# Patient Record
Sex: Male | Born: 1960
Health system: Southern US, Community
[De-identification: ages and names within clinical notes are randomized; demographics above are authoritative.]

## PROBLEM LIST (undated history)

## (undated) HISTORY — PX: ELBOW SURGERY: SHX618

---

## 2014-04-07 ENCOUNTER — Other Ambulatory Visit: Payer: Self-pay | Admitting: Family Medicine

## 2014-04-07 DIAGNOSIS — R109 Unspecified abdominal pain: Secondary | ICD-10-CM

## 2014-04-11 ENCOUNTER — Ambulatory Visit
Admission: RE | Admit: 2014-04-11 | Discharge: 2014-04-11 | Disposition: A | Discharge status: Home / Self Care | Payer: Managed Care, Other (non HMO) | Source: Ambulatory Visit | Attending: Family Medicine | Admitting: Family Medicine

## 2014-04-11 DIAGNOSIS — R109 Unspecified abdominal pain: Secondary | ICD-10-CM

## 2014-04-11 MED ORDER — IOPAMIDOL (ISOVUE-300) INJECTION 61%
100.0000 mL | Freq: Once | INTRAVENOUS | Status: AC | PRN
  Administered 2014-04-11: 100 mL via INTRAVENOUS

## 2014-12-05 ENCOUNTER — Observation Stay (HOSPITAL_COMMUNITY): Payer: Managed Care, Other (non HMO) | Admitting: Anesthesiology

## 2014-12-05 ENCOUNTER — Observation Stay (HOSPITAL_COMMUNITY)
Admission: EM | Admit: 2014-12-05 | Discharge: 2014-12-06 | Disposition: A | Discharge status: Home / Self Care | Payer: Managed Care, Other (non HMO) | Attending: Surgery | Admitting: Surgery

## 2014-12-05 ENCOUNTER — Encounter (HOSPITAL_COMMUNITY): Payer: Self-pay | Admitting: Emergency Medicine

## 2014-12-05 ENCOUNTER — Emergency Department (HOSPITAL_COMMUNITY): Payer: Managed Care, Other (non HMO)

## 2014-12-05 ENCOUNTER — Encounter (HOSPITAL_COMMUNITY)
Admission: EM | Disposition: A | Discharge status: Home / Self Care | Payer: Self-pay | Source: Home / Self Care | Attending: Emergency Medicine

## 2014-12-05 DIAGNOSIS — Z9049 Acquired absence of other specified parts of digestive tract: Secondary | ICD-10-CM

## 2014-12-05 DIAGNOSIS — K3580 Unspecified acute appendicitis: Secondary | ICD-10-CM | POA: Diagnosis present

## 2014-12-05 DIAGNOSIS — F17210 Nicotine dependence, cigarettes, uncomplicated: Secondary | ICD-10-CM | POA: Insufficient documentation

## 2014-12-05 DIAGNOSIS — K4090 Unilateral inguinal hernia, without obstruction or gangrene, not specified as recurrent: Secondary | ICD-10-CM | POA: Insufficient documentation

## 2014-12-05 DIAGNOSIS — K353 Acute appendicitis with localized peritonitis: Secondary | ICD-10-CM | POA: Diagnosis not present

## 2014-12-05 HISTORY — PX: LAPAROSCOPIC APPENDECTOMY: SHX408

## 2014-12-05 LAB — CBC
HCT: 46.4 % (ref 39.0–52.0)
Hemoglobin: 16 g/dL (ref 13.0–17.0)
MCH: 31.2 pg (ref 26.0–34.0)
MCHC: 34.5 g/dL (ref 30.0–36.0)
MCV: 90.4 fL (ref 78.0–100.0)
PLATELETS: 223 10*3/uL (ref 150–400)
RBC: 5.13 MIL/uL (ref 4.22–5.81)
RDW: 12.8 % (ref 11.5–15.5)
WBC: 10.8 10*3/uL — ABNORMAL HIGH (ref 4.0–10.5)

## 2014-12-05 LAB — URINALYSIS, ROUTINE W REFLEX MICROSCOPIC
GLUCOSE, UA: NEGATIVE mg/dL
KETONES UR: NEGATIVE mg/dL
LEUKOCYTES UA: NEGATIVE
Nitrite: NEGATIVE
PH: 6 (ref 5.0–8.0)
Protein, ur: NEGATIVE mg/dL
Specific Gravity, Urine: 1.027 (ref 1.005–1.030)
Urobilinogen, UA: 1 mg/dL (ref 0.0–1.0)

## 2014-12-05 LAB — COMPREHENSIVE METABOLIC PANEL
ALK PHOS: 85 U/L (ref 38–126)
ALT: 11 U/L — ABNORMAL LOW (ref 17–63)
ANION GAP: 7 (ref 5–15)
AST: 13 U/L — ABNORMAL LOW (ref 15–41)
Albumin: 3.9 g/dL (ref 3.5–5.0)
BILIRUBIN TOTAL: 1.6 mg/dL — AB (ref 0.3–1.2)
BUN: 12 mg/dL (ref 6–20)
CALCIUM: 9.1 mg/dL (ref 8.9–10.3)
CO2: 27 mmol/L (ref 22–32)
Chloride: 104 mmol/L (ref 101–111)
Creatinine, Ser: 0.97 mg/dL (ref 0.61–1.24)
GFR calc Af Amer: >60 mL/min (ref >60)
Glucose, Bld: 114 mg/dL — ABNORMAL HIGH (ref 65–99)
POTASSIUM: 4.1 mmol/L (ref 3.5–5.1)
Sodium: 138 mmol/L (ref 135–145)
TOTAL PROTEIN: 7.3 g/dL (ref 6.5–8.1)

## 2014-12-05 LAB — URINE MICROSCOPIC-ADD ON

## 2014-12-05 LAB — LIPASE, BLOOD: Lipase: 22 U/L (ref 11–51)

## 2014-12-05 SURGERY — APPENDECTOMY, LAPAROSCOPIC
Anesthesia: General

## 2014-12-05 MED ORDER — ONDANSETRON 4 MG PO TBDP: 4.0000 mg | ORAL_TABLET | Freq: Four times a day (QID) | ORAL | Status: DC | PRN

## 2014-12-05 MED ORDER — SODIUM CHLORIDE 0.9 % IV BOLUS (SEPSIS)
1000.0000 mL | Freq: Once | INTRAVENOUS | Status: AC
  Administered 2014-12-05: 1000 mL via INTRAVENOUS

## 2014-12-05 MED ORDER — KETOROLAC TROMETHAMINE 30 MG/ML IJ SOLN: 30.0000 mg | Freq: Once | INTRAMUSCULAR | Status: DC

## 2014-12-05 MED ORDER — ONDANSETRON HCL 4 MG/2ML IJ SOLN: 4.0000 mg | Freq: Four times a day (QID) | INTRAMUSCULAR | Status: DC | PRN

## 2014-12-05 MED ORDER — OXYCODONE HCL 5 MG PO TABS: 5.0000 mg | ORAL_TABLET | Freq: Once | ORAL | Status: DC | PRN

## 2014-12-05 MED ORDER — ONDANSETRON HCL 4 MG/2ML IJ SOLN
4.0000 mg | Freq: Four times a day (QID) | INTRAMUSCULAR | Status: DC | PRN
  Administered 2014-12-05: 4 mg via INTRAVENOUS
  Filled 2014-12-05: qty 2

## 2014-12-05 MED ORDER — PIPERACILLIN-TAZOBACTAM 3.375 G IVPB: 3.3750 g | Freq: Three times a day (TID) | INTRAVENOUS | Status: DC

## 2014-12-05 MED ORDER — FENTANYL CITRATE (PF) 100 MCG/2ML IJ SOLN
INTRAMUSCULAR | Status: AC
  Filled 2014-12-05: qty 4

## 2014-12-05 MED ORDER — DIPHENHYDRAMINE HCL 25 MG PO CAPS: 25.0000 mg | ORAL_CAPSULE | Freq: Four times a day (QID) | ORAL | Status: DC | PRN

## 2014-12-05 MED ORDER — PROPOFOL 10 MG/ML IV BOLUS
INTRAVENOUS | Status: DC | PRN
  Administered 2014-12-05: 40 mg via INTRAVENOUS
  Administered 2014-12-05: 140 mg via INTRAVENOUS

## 2014-12-05 MED ORDER — HEPARIN SODIUM (PORCINE) 5000 UNIT/ML IJ SOLN
5000.0000 [IU] | Freq: Three times a day (TID) | INTRAMUSCULAR | Status: DC
  Administered 2014-12-05 – 2014-12-06 (×2): 5000 [IU] via SUBCUTANEOUS
  Filled 2014-12-05 (×5): qty 1

## 2014-12-05 MED ORDER — EPHEDRINE SULFATE 50 MG/ML IJ SOLN
INTRAMUSCULAR | Status: DC | PRN
  Administered 2014-12-05: 10 mg via INTRAVENOUS

## 2014-12-05 MED ORDER — PANTOPRAZOLE SODIUM 40 MG IV SOLR
40.0000 mg | Freq: Every day | INTRAVENOUS | Status: DC
  Administered 2014-12-05: 40 mg via INTRAVENOUS
  Filled 2014-12-05 (×2): qty 40

## 2014-12-05 MED ORDER — HYDROMORPHONE HCL 1 MG/ML IJ SOLN
0.2500 mg | INTRAMUSCULAR | Status: DC | PRN
  Administered 2014-12-05 (×2): 0.5 mg via INTRAVENOUS

## 2014-12-05 MED ORDER — HYDROCODONE-ACETAMINOPHEN 5-325 MG PO TABS: 1.0000 | ORAL_TABLET | ORAL | Status: DC | PRN

## 2014-12-05 MED ORDER — PIPERACILLIN-TAZOBACTAM 3.375 G IVPB
3.3750 g | Freq: Three times a day (TID) | INTRAVENOUS | Status: DC
  Administered 2014-12-05: 3.375 g via INTRAVENOUS
  Filled 2014-12-05: qty 50

## 2014-12-05 MED ORDER — MORPHINE SULFATE (PF) 2 MG/ML IV SOLN: 2.0000 mg | INTRAVENOUS | Status: DC | PRN

## 2014-12-05 MED ORDER — HYDROMORPHONE HCL 1 MG/ML IJ SOLN
INTRAMUSCULAR | Status: AC
  Filled 2014-12-05: qty 1

## 2014-12-05 MED ORDER — FENTANYL CITRATE (PF) 250 MCG/5ML IJ SOLN
INTRAMUSCULAR | Status: AC
  Filled 2014-12-05: qty 25

## 2014-12-05 MED ORDER — FENTANYL CITRATE (PF) 100 MCG/2ML IJ SOLN
INTRAMUSCULAR | Status: DC | PRN
  Administered 2014-12-05: 100 ug via INTRAVENOUS
  Administered 2014-12-05 (×3): 50 ug via INTRAVENOUS
  Administered 2014-12-05: 100 ug via INTRAVENOUS
  Administered 2014-12-05 (×2): 50 ug via INTRAVENOUS

## 2014-12-05 MED ORDER — LACTATED RINGERS IR SOLN
Status: DC | PRN
  Administered 2014-12-05: 1

## 2014-12-05 MED ORDER — NICOTINE 21 MG/24HR TD PT24
21.0000 mg | MEDICATED_PATCH | Freq: Every day | TRANSDERMAL | Status: DC
  Administered 2014-12-05: 21 mg via TRANSDERMAL
  Filled 2014-12-05 (×2): qty 1

## 2014-12-05 MED ORDER — NEOSTIGMINE METHYLSULFATE 10 MG/10ML IV SOLN
INTRAVENOUS | Status: DC | PRN
  Administered 2014-12-05: 3 mg via INTRAVENOUS

## 2014-12-05 MED ORDER — IOHEXOL 300 MG/ML  SOLN
50.0000 mL | Freq: Once | INTRAMUSCULAR | Status: AC | PRN
  Administered 2014-12-05: 50 mL via ORAL

## 2014-12-05 MED ORDER — IOHEXOL 300 MG/ML  SOLN
100.0000 mL | Freq: Once | INTRAMUSCULAR | Status: AC | PRN
  Administered 2014-12-05: 100 mL via INTRAVENOUS

## 2014-12-05 MED ORDER — KCL IN DEXTROSE-NACL 20-5-0.45 MEQ/L-%-% IV SOLN
INTRAVENOUS | Status: DC
  Administered 2014-12-05: 19:00:00 via INTRAVENOUS
  Filled 2014-12-05 (×3): qty 1000

## 2014-12-05 MED ORDER — ONDANSETRON HCL 4 MG/2ML IJ SOLN
INTRAMUSCULAR | Status: DC | PRN
  Administered 2014-12-05: 4 mg via INTRAVENOUS

## 2014-12-05 MED ORDER — LACTATED RINGERS IV SOLN
INTRAVENOUS | Status: DC
  Administered 2014-12-05: 1000 mL via INTRAVENOUS

## 2014-12-05 MED ORDER — GLYCOPYRROLATE 0.2 MG/ML IJ SOLN
INTRAMUSCULAR | Status: DC | PRN
  Administered 2014-12-05: 0.4 mg via INTRAVENOUS

## 2014-12-05 MED ORDER — MORPHINE SULFATE (PF) 4 MG/ML IV SOLN
4.0000 mg | Freq: Once | INTRAVENOUS | Status: DC
  Filled 2014-12-05: qty 1

## 2014-12-05 MED ORDER — MORPHINE SULFATE (PF) 2 MG/ML IV SOLN
2.0000 mg | INTRAVENOUS | Status: DC | PRN
  Administered 2014-12-05: 2 mg via INTRAVENOUS
  Filled 2014-12-05: qty 1

## 2014-12-05 MED ORDER — BUPIVACAINE-EPINEPHRINE (PF) 0.25% -1:200000 IJ SOLN
INTRAMUSCULAR | Status: DC | PRN
  Administered 2014-12-05: 10 mL via PERINEURAL

## 2014-12-05 MED ORDER — POTASSIUM CHLORIDE IN NACL 20-0.9 MEQ/L-% IV SOLN
INTRAVENOUS | Status: DC
  Filled 2014-12-05 (×2): qty 1000

## 2014-12-05 MED ORDER — MIDAZOLAM HCL 2 MG/2ML IJ SOLN
INTRAMUSCULAR | Status: AC
  Filled 2014-12-05: qty 4

## 2014-12-05 MED ORDER — PHENYLEPHRINE HCL 10 MG/ML IJ SOLN
INTRAMUSCULAR | Status: DC | PRN
  Administered 2014-12-05: 120 ug via INTRAVENOUS
  Administered 2014-12-05: 80 ug via INTRAVENOUS

## 2014-12-05 MED ORDER — LIDOCAINE HCL (CARDIAC) 20 MG/ML IV SOLN
INTRAVENOUS | Status: DC | PRN
  Administered 2014-12-05: 50 mg via INTRAVENOUS

## 2014-12-05 MED ORDER — MIDAZOLAM HCL 5 MG/5ML IJ SOLN
INTRAMUSCULAR | Status: DC | PRN
  Administered 2014-12-05: 2 mg via INTRAVENOUS

## 2014-12-05 MED ORDER — PROPOFOL 10 MG/ML IV BOLUS
INTRAVENOUS | Status: AC
  Filled 2014-12-05: qty 20

## 2014-12-05 MED ORDER — PIPERACILLIN-TAZOBACTAM 3.375 G IVPB
INTRAVENOUS | Status: AC
Start: 2014-12-05 — End: 2014-12-05
  Filled 2014-12-05: qty 50

## 2014-12-05 MED ORDER — ONDANSETRON HCL 4 MG/2ML IJ SOLN
INTRAMUSCULAR | Status: AC
  Filled 2014-12-05: qty 2

## 2014-12-05 MED ORDER — DIPHENHYDRAMINE HCL 50 MG/ML IJ SOLN: 25.0000 mg | Freq: Four times a day (QID) | INTRAMUSCULAR | Status: DC | PRN

## 2014-12-05 MED ORDER — OXYCODONE HCL 5 MG/5ML PO SOLN
5.0000 mg | Freq: Once | ORAL | Status: DC | PRN
  Filled 2014-12-05: qty 5

## 2014-12-05 MED ORDER — LIDOCAINE HCL (CARDIAC) 20 MG/ML IV SOLN
INTRAVENOUS | Status: AC
  Filled 2014-12-05: qty 5

## 2014-12-05 MED ORDER — ONDANSETRON HCL 4 MG/2ML IJ SOLN
4.0000 mg | Freq: Once | INTRAMUSCULAR | Status: AC
  Administered 2014-12-05: 4 mg via INTRAVENOUS
  Filled 2014-12-05: qty 2

## 2014-12-05 MED ORDER — MORPHINE SULFATE (PF) 2 MG/ML IV SOLN: 1.0000 mg | INTRAVENOUS | Status: DC | PRN

## 2014-12-05 MED ORDER — BUPIVACAINE-EPINEPHRINE (PF) 0.25% -1:200000 IJ SOLN
INTRAMUSCULAR | Status: AC
  Filled 2014-12-05: qty 30

## 2014-12-05 MED ORDER — ROCURONIUM BROMIDE 100 MG/10ML IV SOLN
INTRAVENOUS | Status: DC | PRN
  Administered 2014-12-05: 40 mg via INTRAVENOUS

## 2014-12-05 MED ORDER — SUCCINYLCHOLINE CHLORIDE 20 MG/ML IJ SOLN
INTRAMUSCULAR | Status: DC | PRN
  Administered 2014-12-05: 80 mg via INTRAVENOUS

## 2014-12-05 SURGICAL SUPPLY — 36 items
APPLIER CLIP ROT 10 11.4 M/L (STAPLE)
CABLE HIGH FREQUENCY MONO STRZ (ELECTRODE) IMPLANT
CLIP APPLIE ROT 10 11.4 M/L (STAPLE) IMPLANT
COVER SURGICAL LIGHT HANDLE (MISCELLANEOUS) ×2 IMPLANT
CUTTER FLEX LINEAR 45M (STAPLE) IMPLANT
DECANTER SPIKE VIAL GLASS SM (MISCELLANEOUS) ×2 IMPLANT
DRAPE LAPAROSCOPIC ABDOMINAL (DRAPES) ×2 IMPLANT
ELECT REM PT RETURN 9FT ADLT (ELECTROSURGICAL) ×2
ELECTRODE REM PT RTRN 9FT ADLT (ELECTROSURGICAL) ×1 IMPLANT
ENDOLOOP SUT PDS II  0 18 (SUTURE)
ENDOLOOP SUT PDS II 0 18 (SUTURE) IMPLANT
GLOVE BIOGEL M 8.0 STRL (GLOVE) ×2 IMPLANT
GOWN STRL REUS W/TWL XL LVL3 (GOWN DISPOSABLE) ×4 IMPLANT
KIT BASIN OR (CUSTOM PROCEDURE TRAY) ×2 IMPLANT
LIQUID BAND (GAUZE/BANDAGES/DRESSINGS) ×2 IMPLANT
NS IRRIG 1000ML POUR BTL (IV SOLUTION) ×2 IMPLANT
POUCH RETRIEVAL ECOSAC 10 (ENDOMECHANICALS) ×1 IMPLANT
POUCH RETRIEVAL ECOSAC 10MM (ENDOMECHANICALS) ×1
POUCH SPECIMEN RETRIEVAL 10MM (ENDOMECHANICALS) IMPLANT
RELOAD 45 VASCULAR/THIN (ENDOMECHANICALS) IMPLANT
RELOAD STAPLE TA45 3.5 REG BLU (ENDOMECHANICALS) IMPLANT
SCISSORS LAP 5X45 EPIX DISP (ENDOMECHANICALS) ×2 IMPLANT
SCRUB PCMX 4 OZ (MISCELLANEOUS) ×2 IMPLANT
SET IRRIG TUBING LAPAROSCOPIC (IRRIGATION / IRRIGATOR) ×2 IMPLANT
SHEARS CURVED HARMONIC AC 45CM (MISCELLANEOUS) ×2 IMPLANT
SLEEVE XCEL OPT CAN 5 100 (ENDOMECHANICALS) ×2 IMPLANT
STAPLER VISISTAT 35W (STAPLE) IMPLANT
SUT MNCRL AB 4-0 PS2 18 (SUTURE) ×2 IMPLANT
SUT VIC AB 4-0 SH 18 (SUTURE) ×2 IMPLANT
TOWEL OR 17X26 10 PK STRL BLUE (TOWEL DISPOSABLE) ×4 IMPLANT
TRAY FOLEY W/METER SILVER 14FR (SET/KITS/TRAYS/PACK) IMPLANT
TRAY LAPAROSCOPIC (CUSTOM PROCEDURE TRAY) ×2 IMPLANT
TROCAR BLADELESS OPT 5 100 (ENDOMECHANICALS) ×2 IMPLANT
TROCAR HASSON 5MM (TROCAR) ×2 IMPLANT
TROCAR XCEL BLUNT TIP 100MML (ENDOMECHANICALS) ×2 IMPLANT
TROCAR XCEL NON-BLD 11X100MML (ENDOMECHANICALS) IMPLANT

## 2014-12-05 NOTE — Transfer of Care (Signed)
Immediate Anesthesia Transfer of Care Note  Patient: Clinton GilmoreChristopher Padilla  Procedure(s) Performed: Procedure(s): APPENDECTOMY LAPAROSCOPIC (N/A)  Patient Location: PACU  Anesthesia Type:General  Level of Consciousness: sedated, patient cooperative and responds to stimulation  Airway & Oxygen Therapy: Patient Spontanous Breathing and Patient connected to face mask oxygen  Post-op Assessment: Report given to RN and Post -op Vital signs reviewed and stable  Post vital signs: Reviewed and stable  Last Vitals:  Filed Vitals:   12/05/14 1129  BP: 114/75  Pulse: 88  Temp: 36.9 C  Resp: 18    Complications: No apparent anesthesia complications

## 2014-12-05 NOTE — Anesthesia Preprocedure Evaluation (Signed)
Anesthesia Evaluation  Patient identified by MRN, date of birth, ID band Patient awake    Reviewed: Allergy & Precautions, NPO status , Patient's Chart, lab work & pertinent test results  Airway Mallampati: II   Neck ROM: full    Dental   Pulmonary Current Smoker,    breath sounds clear to auscultation       Cardiovascular negative cardio ROS   Rhythm:regular Rate:Normal     Neuro/Psych    GI/Hepatic   Endo/Other    Renal/GU      Musculoskeletal   Abdominal   Peds  Hematology   Anesthesia Other Findings   Reproductive/Obstetrics                             Anesthesia Physical Anesthesia Plan  ASA: II and emergent  Anesthesia Plan: General   Post-op Pain Management:    Induction: Intravenous  Airway Management Planned: Oral ETT  Additional Equipment:   Intra-op Plan:   Post-operative Plan: Extubation in OR  Informed Consent: I have reviewed the patients History and Physical, chart, labs and discussed the procedure including the risks, benefits and alternatives for the proposed anesthesia with the patient or authorized representative who has indicated his/her understanding and acceptance.     Plan Discussed with: CRNA, Anesthesiologist and Surgeon  Anesthesia Plan Comments:         Anesthesia Quick Evaluation

## 2014-12-05 NOTE — ED Provider Notes (Signed)
CSN: [id number]     Arrival date & time 12/05/14  0840 History   First MD Initiated Contact with Patient 12/05/14 [phone]     Chief Complaint  Patient presents with  . Abdominal Pain  . Nausea     (Consider location/radiation/quality/duration/timing/severity/associated sxs/prior Treatment) HPI   Clinton Padilla is a 54 y.o. male with no significant PMH who presents with 1 day history of gradual onset, worsening abdominal pain since yesterday morning.  He reports the pain began gradually yesterday beginning in the periumbilical region and epigastric region, then moved to the LLQ, and has now stayed in the RLQ.  He describes it as constant, burning and sharp, 4/10 pain.  He has tried ibuprofen.  Nothing makes it better.  Deep breathing and walking makes it worse.  It does not seem to be associated with food.  Assoc symptoms include decreased appetite, chills, and nausea.  Denies vomiting, diarrhea, constipation, bloody stools, urinary symptoms, CP, SOB, or fever.  No history of abdominal surgeries.  Nothing like this has happened before.    History reviewed. No pertinent past medical history. Past Surgical History  Procedure Laterality Date  . Elbow surgery     No family history on file. Social History  Substance Use Topics  . Smoking status: Current Every Day Smoker    Types: Cigarettes  . Smokeless tobacco: None  . Alcohol Use: No     Comment: social     Review of Systems All other systems negative unless otherwise stated in HPI    Allergies  Review of patient's allergies indicates no known allergies.  Home Medications   Prior to Admission medications   Medication Sig Start Date End Date Taking? Authorizing Provider  ibuprofen (ADVIL,MOTRIN) 200 MG tablet Take 400 mg by mouth every 6 (six) hours as needed for fever, headache, moderate pain or cramping.   Yes Historical Provider, MD   BP 114/75 mmHg  Pulse 88  Temp(Src) 98.5 F (36.9 C) (Temporal)  Resp 18  SpO2  94% Physical Exam  Constitutional: He is oriented to person, place, and time. He appears well-developed and well-nourished.  Non-toxic, well appearing male who appears stated age.   HENT:  Head: Normocephalic and atraumatic.  Mouth/Throat: Oropharynx is clear and moist.  Eyes: Conjunctivae are normal. Pupils are equal, round, and reactive to light.  Neck: Normal range of motion. Neck supple.  Cardiovascular: Normal rate, regular rhythm and normal heart sounds.   No murmur heard. Pulmonary/Chest: Effort normal and breath sounds normal. No accessory muscle usage or stridor. No respiratory distress. He has no wheezes. He has no rhonchi. He has no rales.  Abdominal: Soft. Bowel sounds are normal. He exhibits no distension. There is tenderness in the right lower quadrant. There is guarding (with palpation of RLQ). There is no rigidity, no rebound, no tenderness at McBurney's point and negative Murphy's sign.  Negative Rovsig sign.  Musculoskeletal: Normal range of motion.  Lymphadenopathy:    He has no cervical adenopathy.  Neurological: He is alert and oriented to person, place, and time.  Speech clear without dysarthria.  Skin: Skin is warm and dry.  Psychiatric: He has a normal mood and affect. His behavior is normal.    ED Course  Procedures (including critical care time) Labs Review Labs Reviewed  COMPREHENSIVE METABOLIC PANEL - Abnormal; Notable for the following:    Glucose, Bld 114 (*)    AST 13 (*)    ALT 11 (*)    Total Bilirubin 1.6 (*)  All other components within normal limits  CBC - Abnormal; Notable for the following:    WBC 10.8 (*)    All other components within normal limits  URINALYSIS, ROUTINE W REFLEX MICROSCOPIC (NOT [facility]) - Abnormal; Notable for the following:    Color, Urine AMBER (*)    Hgb urine dipstick TRACE (*)    Bilirubin Urine SMALL (*)    All other components within normal limits  URINE MICROSCOPIC-ADD ON - Abnormal; Notable for the following:     Bacteria, UA FEW (*)    All other components within normal limits  LIPASE, BLOOD    Imaging Review Ct Abdomen Pelvis W Contrast  12/05/2014  CLINICAL DATA:  Right lower quadrant pain starting Saturday, possible appendicitis. EXAM: CT ABDOMEN AND PELVIS WITH CONTRAST TECHNIQUE: Multidetector CT imaging of the abdomen and pelvis was performed using the standard protocol following bolus administration of intravenous contrast. CONTRAST:  50mL OMNIPAQUE IOHEXOL 300 MG/ML SOLN, 100mL OMNIPAQUE IOHEXOL 300 MG/ML SOLN COMPARISON:  None. FINDINGS: Lung bases are unremarkable. Enhanced liver, spleen, pancreas and adrenal glands are unremarkable. Tiny accessory splenule. Kidneys are symmetrical in size and enhancement. Mild atherosclerotic calcifications of distal abdominal aorta. No hydronephrosis or hydroureter. No aortic aneurysm. No small bowel obstruction. No free abdominal air. There is abnormal thickening of the appendix and enhancing of wall of the appendix. The appendix measures 1.4 cm in diameter in axial image 49. There is stranding of surrounding fat and trace periappendiceal fluid. Findings are consistent with acute appendicitis. No abscess or definite perforation is noted. The urinary bladder is unremarkable. Prostate gland and seminal vesicles are unremarkable. No destructive bony lesions are noted within pelvis. Sagittal images of the spine are unremarkable. The delayed renal images shows bilateral renal symmetrical excretion. Bilateral visualized proximal ureter is unremarkable. IMPRESSION: 1. Abnormal enhancement and thickening of the appendix consistent with acute appendicitis. Small amount of periappendiceal fluid. No evidence of perforation or appendiceal abscess. 2. No small bowel obstruction. 3. No free abdominal air. 4. No hydronephrosis or hydroureter. These results were called by telephone at the time of interpretation on 12/05/2014 at 11:16 am to Dr. Dorathy DaftKAYLA Oiva Dibari , who verbally acknowledged  these results. Electronically Signed   By: Natasha MeadLiviu  Pop M.D.   On: 12/05/2014 11:16   I have personally reviewed and evaluated these images and lab results as part of my medical decision-making.   EKG Interpretation None      MDM   Final diagnoses:  Acute appendicitis, unspecified acute appendicitis type    Patient presents with abdominal pain x 1 day.  No fevers.  Assoc sxs include decreased appetite, nausea, and chills.  VSS, NAD, appears non-toxic.  On exam, heart RRR, lungs CTAB, abdomen is soft.  He has TTP in RLQ with guarding with palpation.  Negative McBurney's point, negative Rovsig.  Will obtain labs and start IVF.  Will control pain and nausea.  Will obtain abdominal CT given history and physical exam findings.   CBC shows slight leukocytosis of 10.8.  Hgb 16.0 CMP, lipase unremarkable UA negative.  11:18 AM: Radiologist called with CT abdomen results consistent with acute appendicitis w/out perforation or abscess formation.  Will consult general surgery.  11:43 PM: General surgery will admit him.   Case has been discussed with and seen by Dr. Fayrene FearingJames who agrees with the above plan for admission.   Cheri FowlerKayla Denielle Bayard, PA-C 12/05/14 1224  Rolland PorterMark James, MD 12/14/14 [phone]

## 2014-12-05 NOTE — ED Notes (Signed)
Pt transported to CT ?

## 2014-12-05 NOTE — ED Notes (Signed)
Order changed back to Morphine. MD Fayrene Fearing(James) talked to pt and order has been replaced.

## 2014-12-05 NOTE — Anesthesia Procedure Notes (Signed)
Procedure Name: Intubation Performed by: Kizzie FantasiaARVER, Jannel Lynne J Pre-anesthesia Checklist: Patient identified, Emergency Drugs available, Suction available, Patient being monitored and Timeout performed Patient Re-evaluated:Patient Re-evaluated prior to inductionOxygen Delivery Method: Circle system utilized Preoxygenation: Pre-oxygenation with 100% oxygen Intubation Type: IV induction Ventilation: Mask ventilation without difficulty Laryngoscope Size: Mac and 4 Grade View: Grade II Tube type: Oral Tube size: 7.5 mm Number of attempts: 1 Airway Equipment and Method: Stylet Placement Confirmation: ETT inserted through vocal cords under direct vision,  positive ETCO2,  CO2 detector and breath sounds checked- equal and bilateral Secured at: 23 cm Tube secured with: Tape

## 2014-12-05 NOTE — ED Provider Notes (Addendum)
Patient seen and evaluated. Discussed with Cheri FowlerKayla Rose PA-C. History and exam are consistent with acute appendicitis. Confirmed via CT. Discussed with general surgery. They're en route planning admission for appendectomy  Rolland PorterMark Zaria Taha, MD 12/05/14 1217  Rolland PorterMark Clayten Allcock, MD 12/14/14 1218

## 2014-12-05 NOTE — ED Notes (Signed)
Pt c/o RLQ pain that started on Saturday.  Pt hasnt eaten since then due to nausea. Pt denies vomiting, diarrhea or constipation.

## 2014-12-05 NOTE — H&P (Signed)
Clinton Padilla is an 54 y.o. male.   Chief Complaint: abdominal pain that started on 12/03/14 HPI: Pt reports onset of abd pain on Sunday, and has not been able to eat secondary to nausea, no diarrhea, nausea or vomiting.  Pain started in the  periumbilical area, upper epigastric area,  then to RLQ, 4/10 pain that is sharpe, Not better with ibuprofen, movement or deep breathing makes it worse. Work up shows he is afebrile, VSS, labs show mildly elevated WBC.  CT scan shows:  There is abnormal thickening of the appendix and enhancing of wall of the appendix. The appendix measures 1.4 cm in diameter in axial image 49. There is stranding of surrounding fat and trace periappendiceal fluid. Findings are consistent with acute appendicitis. No abscess or definite perforation is noted.  History reviewed. No pertinent past medical history.  Past Surgical History  Procedure Laterality Date  . Elbow surgery      No family history on file. Social History:  reports that he has been smoking Cigarettes.  He does not have any smokeless tobacco history on file. He reports that he does not drink alcohol. His drug history is not on file. Tobacco 1PPD 33 years Drugs: none ETOH:  Social  Married and works with computers/IT  Allergies: No Known Allergies  Prior to Admission medications   Medication Sig Start Date End Date Taking? Authorizing Provider  ibuprofen (ADVIL,MOTRIN) 200 MG tablet Take 400 mg by mouth every 6 (six) hours as needed for fever, headache, moderate pain or cramping.   Yes Historical Provider, MD     Results for orders placed or performed during the hospital encounter of 12/05/14 (from the past 48 hour(s))  Lipase, blood     Status: None   Collection Time: 12/05/14  8:59 AM  Result Value Ref Range   Lipase 22 11 - 51 U/L  Comprehensive metabolic panel     Status: Abnormal   Collection Time: 12/05/14  8:59 AM  Result Value Ref Range   Sodium 138 135 - 145 mmol/L   Potassium 4.1  3.5 - 5.1 mmol/L   Chloride 104 101 - 111 mmol/L   CO2 27 22 - 32 mmol/L   Glucose, Bld 114 (H) 65 - 99 mg/dL   BUN 12 6 - 20 mg/dL   Creatinine, Ser 0.97 0.61 - 1.24 mg/dL   Calcium 9.1 8.9 - 10.3 mg/dL   Total Protein 7.3 6.5 - 8.1 g/dL   Albumin 3.9 3.5 - 5.0 g/dL   AST 13 (L) 15 - 41 U/L   ALT 11 (L) 17 - 63 U/L   Alkaline Phosphatase 85 38 - 126 U/L   Total Bilirubin 1.6 (H) 0.3 - 1.2 mg/dL   GFR calc non Af Amer >60 >60 mL/min   GFR calc Af Amer >60 >60 mL/min    Comment: (NOTE) The eGFR has been calculated using the CKD EPI equation. This calculation has not been validated in all clinical situations. eGFR's persistently <60 mL/min signify possible Chronic Kidney Disease.    Anion gap 7 5 - 15  CBC     Status: Abnormal   Collection Time: 12/05/14  8:59 AM  Result Value Ref Range   WBC 10.8 (H) 4.0 - 10.5 K/uL   RBC 5.13 4.22 - 5.81 MIL/uL   Hemoglobin 16.0 13.0 - 17.0 g/dL   HCT 46.4 39.0 - 52.0 %   MCV 90.4 78.0 - 100.0 fL   MCH 31.2 26.0 - 34.0 pg   MCHC  34.5 30.0 - 36.0 g/dL   RDW 12.8 11.5 - 15.5 %   Platelets 223 150 - 400 K/uL  Urinalysis, Routine w reflex microscopic (not at [facility])     Status: Abnormal   Collection Time: 12/05/14  9:09 AM  Result Value Ref Range   Color, Urine AMBER (A) YELLOW    Comment: BIOCHEMICALS MAY BE AFFECTED BY COLOR   APPearance CLEAR CLEAR   Specific Gravity, Urine 1.027 1.005 - 1.030   pH 6.0 5.0 - 8.0   Glucose, UA NEGATIVE NEGATIVE mg/dL   Hgb urine dipstick TRACE (A) NEGATIVE   Bilirubin Urine SMALL (A) NEGATIVE   Ketones, ur NEGATIVE NEGATIVE mg/dL   Protein, ur NEGATIVE NEGATIVE mg/dL   Urobilinogen, UA 1.0 0.0 - 1.0 mg/dL   Nitrite NEGATIVE NEGATIVE   Leukocytes, UA NEGATIVE NEGATIVE  Urine microscopic-add on     Status: Abnormal   Collection Time: 12/05/14  9:09 AM  Result Value Ref Range   Squamous Epithelial / LPF RARE RARE   WBC, UA 0-2 <3 WBC/hpf   RBC / HPF 3-6 <3 RBC/hpf   Bacteria, UA FEW (A) RARE    Urine-Other MUCOUS PRESENT    Ct Abdomen Pelvis W Contrast  12/05/2014  CLINICAL DATA:  Right lower quadrant pain starting Saturday, possible appendicitis. EXAM: CT ABDOMEN AND PELVIS WITH CONTRAST TECHNIQUE: Multidetector CT imaging of the abdomen and pelvis was performed using the standard protocol following bolus administration of intravenous contrast. CONTRAST:  2m OMNIPAQUE IOHEXOL 300 MG/ML SOLN, 1012mOMNIPAQUE IOHEXOL 300 MG/ML SOLN COMPARISON:  None. FINDINGS: Lung bases are unremarkable. Enhanced liver, spleen, pancreas and adrenal glands are unremarkable. Tiny accessory splenule. Kidneys are symmetrical in size and enhancement. Mild atherosclerotic calcifications of distal abdominal aorta. No hydronephrosis or hydroureter. No aortic aneurysm. No small bowel obstruction. No free abdominal air. There is abnormal thickening of the appendix and enhancing of wall of the appendix. The appendix measures 1.4 cm in diameter in axial image 49. There is stranding of surrounding fat and trace periappendiceal fluid. Findings are consistent with acute appendicitis. No abscess or definite perforation is noted. The urinary bladder is unremarkable. Prostate gland and seminal vesicles are unremarkable. No destructive bony lesions are noted within pelvis. Sagittal images of the spine are unremarkable. The delayed renal images shows bilateral renal symmetrical excretion. Bilateral visualized proximal ureter is unremarkable. IMPRESSION: 1. Abnormal enhancement and thickening of the appendix consistent with acute appendicitis. Small amount of periappendiceal fluid. No evidence of perforation or appendiceal abscess. 2. No small bowel obstruction. 3. No free abdominal air. 4. No hydronephrosis or hydroureter. These results were called by telephone at the time of interpretation on 12/05/2014 at 11:16 am to Dr. KALonn GeorgiaOSE , who verbally acknowledged these results. Electronically Signed   By: LiLahoma Crocker.D.   On: 12/05/2014  11:16    Review of Systems  Constitutional: Positive for chills. Negative for fever, weight loss, malaise/fatigue and diaphoresis.  HENT: Negative.   Eyes: Negative.   Respiratory: Negative.   Gastrointestinal: Positive for nausea and abdominal pain.  Genitourinary: Negative.        Dark and concentrated   Musculoskeletal: Negative.   Skin: Negative.   Neurological: Negative.  Negative for weakness.  Endo/Heme/Allergies: Negative.   Psychiatric/Behavioral: Negative.     Blood pressure 114/75, pulse 88, temperature 98.5 F (36.9 C), temperature source Temporal, resp. rate 18, SpO2 94 %. Physical Exam  Constitutional: He is oriented to person, place, and time. He appears well-developed and  well-nourished. No distress.  Thin WM  HENT:  Head: Normocephalic and atraumatic.  Nose: Nose normal.  Eyes: Conjunctivae and EOM are normal. Right eye exhibits no discharge. Left eye exhibits no discharge. No scleral icterus.  Neck: Normal range of motion. Neck supple. No JVD present. No tracheal deviation present. No thyromegaly present.  Cardiovascular: Normal rate, regular rhythm, normal heart sounds and intact distal pulses.   No murmur heard. Respiratory: No respiratory distress. He has wheezes (Some inspiratory wheezing). He has no rales. He exhibits no tenderness.  GI: Soft. Bowel sounds are normal. He exhibits no distension and no mass. There is tenderness (RLQ and some in the RUQ). There is rebound (just a little on the right). There is no guarding.  Musculoskeletal: He exhibits no edema or tenderness.  Lymphadenopathy:    He has no cervical adenopathy.  Neurological: He is alert and oriented to person, place, and time. No cranial nerve deficit.  Skin: Skin is warm and dry. No rash noted. He is not diaphoretic. No erythema. No pallor.  Psychiatric: His behavior is normal. Judgment and thought content normal.     Assessment/Plan Acute appendicitis Tobacco use  Plan:  Zosyn,  fluids, and OR later today.    Orlena Garmon 12/05/2014, 11:36 AM

## 2014-12-05 NOTE — Op Note (Signed)
Surgeon: Wenda LowMatt Rylynn Schoneman, MD, FACS  Asst:  none  Anes:  general  Procedure: Laparoscopic appendectomy  Diagnosis: Acute retrocecal appendicitis  Complications: none  EBL:   8 cc  Drains: none  Description of Procedure:  [facility]  After anesthesia was administered and the patient was prepped a timeout was performed.  Access was achieved with Hasson through the umbilicus.  Two 5 mm trocars were placed in the right upper quadrant and the left lower quadrant.  An incipient right inguinal hernia was noted.  The appendix was retrocecal and twisted.  At first look and upon grasping the tip you could roll out an appropriate length acute appendicitis but you could see the origin of the appendix off the cecum in not connected visually with the tip.  I then further mobilized the appendix down to the base and then stapled across the base with a vascular load 4.5 Ethicon stapler.  The stump closure looked good.  The appendix was place into a bag and brought out through the umbilicus.  The midline was closed with a figure of 8 of 0 vicryl with lap vision with the angled 5 mm scope.  Port sites were injected with marcaine and were closed with 5-0 monocryl and Liquiban.    The patient tolerated the procedure well and was taken to the PACU in stable condition.     Clinton B. Daphine DeutscherMartin, MD, [facility], [place] [phone]

## 2014-12-06 LAB — CBC
HEMATOCRIT: 39.4 % (ref 39.0–52.0)
Hemoglobin: 13.3 g/dL (ref 13.0–17.0)
MCH: 30.9 pg (ref 26.0–34.0)
MCHC: 33.8 g/dL (ref 30.0–36.0)
MCV: 91.4 fL (ref 78.0–100.0)
Platelets: 192 10*3/uL (ref 150–400)
RBC: 4.31 MIL/uL (ref 4.22–5.81)
RDW: 12.9 % (ref 11.5–15.5)
WBC: 7.4 10*3/uL (ref 4.0–10.5)

## 2014-12-06 MED ORDER — IBUPROFEN 200 MG PO TABS: 600.0000 mg | ORAL_TABLET | Freq: Four times a day (QID) | ORAL | Status: DC | PRN

## 2014-12-06 MED ORDER — HYDROCODONE-ACETAMINOPHEN 5-325 MG PO TABS: 1.0000 | ORAL_TABLET | ORAL | Status: DC | PRN

## 2014-12-06 MED ORDER — ACETAMINOPHEN 325 MG PO TABS: 650.0000 mg | ORAL_TABLET | Freq: Four times a day (QID) | ORAL | Status: DC | PRN

## 2014-12-06 MED ORDER — BUPIVACAINE HCL (PF) 0.5 % IJ SOLN
INTRAMUSCULAR | Status: AC
  Filled 2014-12-06: qty 30

## 2014-12-06 NOTE — Discharge Instructions (Signed)
Laparoscopic Appendectomy, Adult, Care After °Refer to this sheet in the next few weeks. These instructions provide you with information on caring for yourself after your procedure. Your caregiver may also give you more specific instructions. Your treatment has been planned according to current medical practices, but problems sometimes occur. Call your caregiver if you have any problems or questions after your procedure. °HOME CARE INSTRUCTIONS °· Do not drive while taking narcotic pain medicines. °· Use stool softener if you become constipated from your pain medicines. °· Change your bandages (dressings) as directed. °· Keep your wounds clean and dry. You may wash the wounds gently with soap and water. Gently pat the wounds dry with a clean towel. °· Do not take baths, swim, or use hot tubs for 10 days, or as instructed by your caregiver. °· Only take over-the-counter or prescription medicines for pain, discomfort, or fever as directed by your caregiver. °· You may continue your normal diet as directed. °· Do not lift more than 10 pounds (4.5 kg) or play contact sports for 3 weeks, or as directed. °· Slowly increase your activity after surgery. °· Take deep breaths to avoid getting a lung infection (pneumonia). °SEEK MEDICAL CARE IF: °· You have redness, swelling, or increasing pain in your wounds. °· You have pus coming from your wounds. °· You have drainage from a wound that lasts longer than 1 day. °· You notice a bad smell coming from the wounds or dressing. °· Your wound edges break open after stitches (sutures) have been removed. °· You notice increasing pain in the shoulders (shoulder strap areas) or near your shoulder blades. °· You develop dizzy episodes or fainting while standing. °· You develop shortness of breath. °· You develop persistent nausea or vomiting. °· You cannot control your bowel functions or lose your appetite. °· You develop diarrhea. °SEEK IMMEDIATE MEDICAL CARE IF:  °· You have a  fever. °· You develop a rash. °· You have difficulty breathing or sharp pains in your chest. °· You develop any reaction or side effects to medicines given. °MAKE SURE YOU: °· Understand these instructions. °· Will watch your condition. °· Will get help right away if you are not doing well or get worse. °  °This information is not intended to replace advice given to you by your health care provider. Make sure you discuss any questions you have with your health care provider. °  °Document Released: 01/07/2005 Document Revised: 05/24/2014 Document Reviewed: 06/27/2014 °Elsevier Interactive Patient Education ©2016 [employer] ° °CCS ______[facility] °LAPAROSCOPIC SURGERY: POST OP INSTRUCTIONS °Always review your discharge instruction sheet given to you by the facility where your surgery was performed. °IF YOU HAVE DISABILITY OR FAMILY LEAVE FORMS, YOU MUST BRING THEM TO THE OFFICE FOR PROCESSING.   °DO NOT GIVE THEM TO YOUR DOCTOR. ° °1. A prescription for pain medication may be given to you upon discharge.  Take your pain medication as prescribed, if needed.  If narcotic pain medicine is not needed, then you may take acetaminophen (Tylenol) or ibuprofen (Advil) as needed. °2. Take your usually prescribed medications unless otherwise directed. °3. If you need a refill on your pain medication, please contact your pharmacy.  They will contact our office to request authorization. Prescriptions will not be filled after 5pm or on week-ends. °4. You should follow a light diet the first few days after arrival home, such as soup and crackers, etc.  Be sure to include lots of fluids daily. °5. Most   patients will experience some swelling and bruising in the area of the incisions.  Ice packs will help.  Swelling and bruising can take several days to resolve.  °6. It is common to experience some constipation if taking pain medication after surgery.  Increasing fluid intake and taking a stool softener (such as  Colace) will usually help or prevent this problem from occurring.  A mild laxative (Milk of Magnesia or Miralax) should be taken according to package instructions if there are no bowel movements after 48 hours. °7. Unless discharge instructions indicate otherwise, you may remove your bandages 24-48 hours after surgery, and you may shower at that time.  You may have steri-strips (small skin tapes) in place directly over the incision.  These strips should be left on the skin for 7-10 days.  If your surgeon used skin glue on the incision, you may shower in 24 hours.  The glue will flake off over the next 2-3 weeks.  Any sutures or staples will be removed at the office during your follow-up visit. °8. ACTIVITIES:  You may resume regular (light) daily activities beginning the next day--such as daily self-care, walking, climbing stairs--gradually increasing activities as tolerated.  You may have sexual intercourse when it is comfortable.  Refrain from any heavy lifting or straining until approved by your doctor. °a. You may drive when you are no longer taking prescription pain medication, you can comfortably wear a seatbelt, and you can safely maneuver your car and apply brakes. °b. RETURN TO WORK:  __________________________________________________________ °9. You should see your doctor in the office for a follow-up appointment approximately 2-3 weeks after your surgery.  Make sure that you call for this appointment within a day or two after you arrive home to insure a convenient appointment time. °10. OTHER INSTRUCTIONS: __________________________________________________________________________________________________________________________ __________________________________________________________________________________________________________________________ °WHEN TO CALL YOUR DOCTOR: °1. Fever over 101.0 °2. Inability to urinate °3. Continued bleeding from incision. °4. Increased pain, redness, or drainage from the  incision. °5. Increasing abdominal pain ° °The clinic staff is available to answer your questions during regular business hours.  Please don’t hesitate to call and ask to speak to one of the nurses for clinical concerns.  If you have a medical emergency, go to the nearest emergency room or call 911.  A surgeon from [facility] is always on call at the hospital. °[address], [address], [place], [place]  [address] ? [address] [address], [place], [place]   [address] °[phone] ? [phone] ? FAX [phone] °Web site: [email or web address] ° °

## 2014-12-06 NOTE — Progress Notes (Signed)
1 Day Post-Op  Subjective: He looks great, going to order breakfast and then home if no issues.  I told him to walk, try the pain pills before he leaves.  I gave him post op instructions.  Objective: Vital signs in last 24 hours: Temp:  [97.4 F (36.3 C)-98.5 F (36.9 C)] 98 F (36.7 C) (11/15 0428) Pulse Rate:  [75-120] 86 (11/15 0428) Resp:  [10-22] 16 (11/15 0428) BP: (107-155)/(58-83) 118/75 mmHg (11/15 0428) SpO2:  [94 %-99 %] 97 % (11/15 0428) Weight:  [68.04 kg (150 lb)] 68.04 kg (150 lb) (11/14 1604) Last BM Date: 12/04/14 300 PO Regular diet Afebrile, VSS WBC 7.4 Intake/Output from previous day: 11/14 0701 - 11/15 0700 In: 1300 [P.O.:300; I.V.:1000] Out: 1775 [Urine:1750; Blood:25] Intake/Output this shift:    General appearance: alert, cooperative and no distress GI: soft sore, sites look fine.  Lab Results:   Recent Labs  12/05/14 0859 12/06/14 0521  WBC 10.8* 7.4  HGB 16.0 13.3  HCT 46.4 39.4  PLT 223 192    BMET  Recent Labs  12/05/14 0859  NA 138  K 4.1  CL 104  CO2 27  GLUCOSE 114*  BUN 12  CREATININE 0.97  CALCIUM 9.1   PT/INR No results for input(s): LABPROT, INR in the last 72 hours.   Recent Labs Lab 12/05/14 0859  AST 13*  ALT 11*  ALKPHOS 85  BILITOT 1.6*  PROT 7.3  ALBUMIN 3.9     Lipase     Component Value Date/Time   LIPASE 22 12/05/2014 0859     Studies/Results: Ct Abdomen Pelvis W Contrast  12/05/2014  CLINICAL DATA:  Right lower quadrant pain starting Saturday, possible appendicitis. EXAM: CT ABDOMEN AND PELVIS WITH CONTRAST TECHNIQUE: Multidetector CT imaging of the abdomen and pelvis was performed using the standard protocol following bolus administration of intravenous contrast. CONTRAST:  50mL OMNIPAQUE IOHEXOL 300 MG/ML SOLN, 100mL OMNIPAQUE IOHEXOL 300 MG/ML SOLN COMPARISON:  None. FINDINGS: Lung bases are unremarkable. Enhanced liver, spleen, pancreas and adrenal glands are unremarkable. Tiny accessory  splenule. Kidneys are symmetrical in size and enhancement. Mild atherosclerotic calcifications of distal abdominal aorta. No hydronephrosis or hydroureter. No aortic aneurysm. No small bowel obstruction. No free abdominal air. There is abnormal thickening of the appendix and enhancing of wall of the appendix. The appendix measures 1.4 cm in diameter in axial image 49. There is stranding of surrounding fat and trace periappendiceal fluid. Findings are consistent with acute appendicitis. No abscess or definite perforation is noted. The urinary bladder is unremarkable. Prostate gland and seminal vesicles are unremarkable. No destructive bony lesions are noted within pelvis. Sagittal images of the spine are unremarkable. The delayed renal images shows bilateral renal symmetrical excretion. Bilateral visualized proximal ureter is unremarkable. IMPRESSION: 1. Abnormal enhancement and thickening of the appendix consistent with acute appendicitis. Small amount of periappendiceal fluid. No evidence of perforation or appendiceal abscess. 2. No small bowel obstruction. 3. No free abdominal air. 4. No hydronephrosis or hydroureter. These results were called by telephone at the time of interpretation on 12/05/2014 at 11:16 am to Dr. Dorathy DaftKAYLA ROSE , who verbally acknowledged these results. Electronically Signed   By: Natasha MeadLiviu  Pop M.D.   On: 12/05/2014 11:16    Medications: . heparin subcutaneous  5,000 Units Subcutaneous 3 times per day  . nicotine  21 mg Transdermal Daily  . pantoprazole (PROTONIX) IV  40 mg Intravenous QHS    Assessment/Plan Acute retrocecal appendicitis S/p Laparoscopic appendectomy, 12/07/14, Dr. Molli HazardMatthew  Martin Hx of tobacco use ABX:  None DVT:  SCD    Plan:  Home today after walking, trying pain pill and eating breakfast.      Sherrie George 12/06/2014

## 2014-12-06 NOTE — Discharge Summary (Signed)
Physician Discharge Summary  Patient ID: Clinton GilmoreChristopher Pham MRN: [id number] DOB/AGE: [birth year] 54 y.o.  Admit date: 12/05/2014 Discharge date: 12/06/2014  Admission Diagnoses:  Acute appendicitis Tobacco use  Discharge Diagnoses:  Acute retrocecal appendicitis Tobacco use  Active Problems:   Acute appendicitis   S/P laparoscopic appendectomy Nov 2016   PROCEDURES: Laparoscopic appendectomy, 12/05/14, Dr. Sandie AnoMatthew Martin  Hospital Course:  Pt reports onset of abd pain on Sunday, and has not been able to eat secondary to nausea, no diarrhea, nausea or vomiting. Pain started in the periumbilical area, upper epigastric area, then to RLQ, 4/10 pain that is sharpe, Not better with ibuprofen, movement or deep breathing makes it worse. Work up shows he is afebrile, VSS, labs show mildly elevated WBC. CT scan shows: There is abnormal thickening of the appendix and enhancing of wall of the appendix. The appendix measures 1.4 cm in diameter in axial image 49. There is stranding of surrounding fat and trace periappendiceal fluid. Findings are consistent with acute appendicitis. No abscess or definite perforation is noted. He was seen and admitted from the ER and taken to the OR that afternoon.  Post op he has done well, we advanced his diet.  He was ready to go home on the first post op day.  Condition on D/C:  Improved   Disposition: 01-Home or Self Care     Medication List    TAKE these medications        acetaminophen 325 MG tablet  Commonly known as:  TYLENOL  Take 2 tablets (650 mg total) by mouth every 6 (six) hours as needed for mild pain, moderate pain, fever or headache.     HYDROcodone-acetaminophen 5-325 MG tablet  Commonly known as:  NORCO/VICODIN  Take 1-2 tablets by mouth every 4 (four) hours as needed for moderate pain.     ibuprofen 200 MG tablet  Commonly known as:  ADVIL,MOTRIN  Take 400 mg by mouth every 6 (six) hours as needed for fever, headache,  moderate pain or cramping.           Follow-up Information    Follow up with [facility] On 12/21/2014.   Specialty:  General Surgery   Why:  Your appointment is at 9:45 AM, be there 30 minutes early for check in.   Contact information:   [address] [place] [address] [phone]       Signed: Sherrie GeorgeJENNINGS,Janeshia Ciliberto 12/06/2014, 10:49 AM

## 2014-12-06 NOTE — Anesthesia Postprocedure Evaluation (Signed)
Anesthesia Post Note  Patient: Clinton GilmoreChristopher Padilla  Procedure(s) Performed: Procedure(s) (LRB): APPENDECTOMY LAPAROSCOPIC (N/A)  Anesthesia type: General  Patient location: PACU  Post pain: Pain level controlled and Adequate analgesia  Post assessment: Post-op Vital signs reviewed, Patient's Cardiovascular Status Stable, Respiratory Function Stable, Patent Airway and Pain level controlled  Last Vitals:  Filed Vitals:   12/06/14 0428  BP: 118/75  Pulse: 86  Temp: 36.7 C  Resp: 16    Post vital signs: Reviewed and stable  Level of consciousness: awake, alert  and oriented  Complications: No apparent anesthesia complications

## 2016-11-07 ENCOUNTER — Ambulatory Visit
Admission: RE | Admit: 2016-11-07 | Discharge: 2016-11-07 | Disposition: A | Discharge status: Home / Self Care | Payer: 59 | Source: Ambulatory Visit | Attending: Family Medicine | Admitting: Family Medicine

## 2016-11-07 ENCOUNTER — Other Ambulatory Visit: Payer: Self-pay | Admitting: Family Medicine

## 2016-11-07 DIAGNOSIS — R0781 Pleurodynia: Secondary | ICD-10-CM

## 2016-11-07 DIAGNOSIS — R079 Chest pain, unspecified: Secondary | ICD-10-CM | POA: Diagnosis not present

## 2016-11-07 DIAGNOSIS — Z720 Tobacco use: Secondary | ICD-10-CM | POA: Diagnosis not present

## 2017-07-10 DIAGNOSIS — E7800 Pure hypercholesterolemia, unspecified: Secondary | ICD-10-CM | POA: Diagnosis not present

## 2017-07-10 DIAGNOSIS — Z23 Encounter for immunization: Secondary | ICD-10-CM | POA: Diagnosis not present

## 2017-07-10 DIAGNOSIS — Z13220 Encounter for screening for lipoid disorders: Secondary | ICD-10-CM | POA: Diagnosis not present

## 2017-07-10 DIAGNOSIS — Z1159 Encounter for screening for other viral diseases: Secondary | ICD-10-CM | POA: Diagnosis not present

## 2017-07-10 DIAGNOSIS — Z0000 Encounter for general adult medical examination without abnormal findings: Secondary | ICD-10-CM | POA: Diagnosis not present

## 2017-10-30 DIAGNOSIS — Z23 Encounter for immunization: Secondary | ICD-10-CM | POA: Diagnosis not present

## 2018-10-15 DIAGNOSIS — E7800 Pure hypercholesterolemia, unspecified: Secondary | ICD-10-CM | POA: Diagnosis not present

## 2018-10-15 DIAGNOSIS — Z0000 Encounter for general adult medical examination without abnormal findings: Secondary | ICD-10-CM | POA: Diagnosis not present

## 2018-11-10 DIAGNOSIS — E7800 Pure hypercholesterolemia, unspecified: Secondary | ICD-10-CM | POA: Diagnosis not present

## 2018-11-10 DIAGNOSIS — Z125 Encounter for screening for malignant neoplasm of prostate: Secondary | ICD-10-CM | POA: Diagnosis not present

## 2018-11-10 DIAGNOSIS — Z0000 Encounter for general adult medical examination without abnormal findings: Secondary | ICD-10-CM | POA: Diagnosis not present

## 2019-07-03 ENCOUNTER — Emergency Department (HOSPITAL_COMMUNITY): Payer: 59

## 2019-07-03 ENCOUNTER — Emergency Department (HOSPITAL_COMMUNITY)
Admission: EM | Admit: 2019-07-03 | Discharge: 2019-07-03 | Disposition: A | Discharge status: Home / Self Care | Payer: 59 | Attending: Emergency Medicine | Admitting: Emergency Medicine

## 2019-07-03 ENCOUNTER — Other Ambulatory Visit: Payer: Self-pay

## 2019-07-03 DIAGNOSIS — F17210 Nicotine dependence, cigarettes, uncomplicated: Secondary | ICD-10-CM | POA: Insufficient documentation

## 2019-07-03 DIAGNOSIS — R0789 Other chest pain: Secondary | ICD-10-CM | POA: Insufficient documentation

## 2019-07-03 DIAGNOSIS — J984 Other disorders of lung: Secondary | ICD-10-CM | POA: Diagnosis not present

## 2019-07-03 LAB — BASIC METABOLIC PANEL
Anion gap: 9 (ref 5–15)
BUN: 14 mg/dL (ref 6–20)
CO2: 21 mmol/L — ABNORMAL LOW (ref 22–32)
Calcium: 8.9 mg/dL (ref 8.9–10.3)
Chloride: 107 mmol/L (ref 98–111)
Creatinine, Ser: 1.17 mg/dL (ref 0.61–1.24)
GFR calc Af Amer: >60 mL/min (ref >60)
GFR calc non Af Amer: >60 mL/min (ref >60)
Glucose, Bld: 104 mg/dL — ABNORMAL HIGH (ref 70–99)
Potassium: 3.9 mmol/L (ref 3.5–5.1)
Sodium: 137 mmol/L (ref 135–145)

## 2019-07-03 LAB — CBC
HCT: 47.6 % (ref 39.0–52.0)
Hemoglobin: 16.3 g/dL (ref 13.0–17.0)
MCH: 31.3 pg (ref 26.0–34.0)
MCHC: 34.2 g/dL (ref 30.0–36.0)
MCV: 91.5 fL (ref 80.0–100.0)
Platelets: 249 10*3/uL (ref 150–400)
RBC: 5.2 MIL/uL (ref 4.22–5.81)
RDW: 12.5 % (ref 11.5–15.5)
WBC: 6.7 10*3/uL (ref 4.0–10.5)
nRBC: 0 % (ref 0.0–0.2)

## 2019-07-03 LAB — TROPONIN I (HIGH SENSITIVITY): Troponin I (High Sensitivity): 2 ng/L (ref <18)

## 2019-07-03 MED ORDER — NAPROXEN 250 MG PO TABS
500.0000 mg | ORAL_TABLET | Freq: Once | ORAL | Status: AC
  Administered 2019-07-03: 500 mg via ORAL
  Filled 2019-07-03: qty 2

## 2019-07-03 MED ORDER — NAPROXEN 500 MG PO TABS: 500.0000 mg | ORAL_TABLET | Freq: Two times a day (BID) | ORAL | 0 refills | Status: DC | PRN

## 2019-07-03 NOTE — Discharge Instructions (Signed)
Please read the attachment on chest wall pain.  Please follow-up with your primary care provider regarding today's encounter.  Take your naproxen, as directed.  Do not combine with other NSAIDs.  Return to the ED or seek immediate medical attention should you develop any new or worsening symptoms.

## 2019-07-03 NOTE — ED Triage Notes (Signed)
Pt developed L sided non-radiating chest pain last night at 6pm when he woke up, decided to sleep on it through the night and see if it was better today, however it is not, so he is here for eval. Pain worse with inspiration, coughing, and palpation. Denies shortness of breath, n/v, or dizziness.

## 2019-07-03 NOTE — ED Provider Notes (Signed)
[facility] EMERGENCY DEPARTMENT Provider Note   CSN: [id number] Arrival date & time: 07/03/19  2992     History Chief Complaint  Patient presents with  . Chest Pain    Clinton Padilla is a 59 y.o. male with no significant past medical history presents the ED accompanied by his wife for focal 5 out of 10 nonradiating left-sided chest pain.  Patient reports that he was taking a nap yesterday and when he woke up at approximately 6 PM he had localized left-sided chest pain that felt comparable to a prior rib contusion years ago.  He states that it is constant and has remained at 5 out of 10 discomfort throughout entire duration.  He states that his symptoms are reproducible with coughing, deep inspiration, and palpation.  Patient has a 40-pack-year smoking history, but otherwise is healthy and does not take any regular medications.  He denies any recent illness, fevers or chills, upper respiratory infection/cough different from his normal smoker's cough, abdominal pain, nausea or vomiting, palpitations or leg swelling, history of blood clots or clotting disorder, diaphoresis, radiating chest discomfort, back pain, urinary symptoms, or changes in bowel habits.  Patient took 400 mg ibuprofen last evening without any obvious improvement.  HPI     No past medical history on file.  Patient Active Problem List   Diagnosis Date Noted  . Acute appendicitis 12/05/2014  . S/P laparoscopic appendectomy Nov 2016 12/05/2014    Past Surgical History:  Procedure Laterality Date  . ELBOW SURGERY    . LAPAROSCOPIC APPENDECTOMY N/A 12/05/2014   Procedure: APPENDECTOMY LAPAROSCOPIC;  Surgeon: Luretha Murphy, MD;  Location: [facility];  Service: General;  Laterality: N/A;       No family history on file.  Social History   Tobacco Use  . Smoking status: Current Every Day Smoker    Types: Cigarettes  Substance Use Topics  . Alcohol use: No    Comment: social   . Drug use: Not on  file    Home Medications Prior to Admission medications   Medication Sig Start Date End Date Taking? Authorizing Provider  acetaminophen (TYLENOL) 325 MG tablet Take 2 tablets (650 mg total) by mouth every 6 (six) hours as needed for mild pain, moderate pain, fever or headache. 12/06/14   Sherrie George, PA-C  HYDROcodone-acetaminophen (NORCO/VICODIN) 5-325 MG tablet Take 1-2 tablets by mouth every 4 (four) hours as needed for moderate pain. 12/06/14   Sherrie George, PA-C  ibuprofen (ADVIL,MOTRIN) 200 MG tablet Take 400 mg by mouth every 6 (six) hours as needed for fever, headache, moderate pain or cramping.    [provider]    Allergies    Patient has no known allergies.  Review of Systems   Review of Systems  Constitutional: Negative for fever.  Respiratory: Negative for shortness of breath.   Cardiovascular: Positive for chest pain. Negative for palpitations and leg swelling.    Physical Exam Updated Vital Signs BP (!) 140/97   Pulse 73   Temp 98.1 F (36.7 C) (Oral)   Resp 16   SpO2 99%   Physical Exam Vitals and nursing note reviewed. Exam conducted with a chaperone present.  Constitutional:      General: He is not in acute distress.    Appearance: Normal appearance. He is not ill-appearing.  HENT:     Head: Normocephalic and atraumatic.  Eyes:     General: No scleral icterus.    Conjunctiva/sclera: Conjunctivae normal.  Cardiovascular:  Rate and Rhythm: Normal rate and regular rhythm.     Pulses: Normal pulses.     Heart sounds: Normal heart sounds.  Pulmonary:     Effort: Pulmonary effort is normal. No respiratory distress.     Breath sounds: Normal breath sounds. No wheezing or rales.  Abdominal:     General: Abdomen is flat. There is no distension.     Palpations: Abdomen is soft.     Tenderness: There is no abdominal tenderness. There is no guarding.  Musculoskeletal:     Cervical back: Normal range of motion. No rigidity.     Right  lower leg: No edema.     Left lower leg: No edema.  Skin:    General: Skin is dry.  Neurological:     Mental Status: He is alert.     GCS: GCS eye subscore is 4. GCS verbal subscore is 5. GCS motor subscore is 6.  Psychiatric:        Mood and Affect: Mood normal.        Behavior: Behavior normal.        Thought Content: Thought content normal.     ED Results / Procedures / Treatments   Labs (all labs ordered are listed, but only abnormal results are displayed) Labs Reviewed  BASIC METABOLIC PANEL - Abnormal; Notable for the following components:      Result Value   CO2 21 (*)    Glucose, Bld 104 (*)    All other components within normal limits  CBC  TROPONIN I (HIGH SENSITIVITY)    EKG None  Radiology DG Chest 2 View  Result Date: 07/03/2019 CLINICAL DATA:  Chest pain, nonradiating chest pain that began at 6 p.m. EXAM: CHEST - 2 VIEW COMPARISON:  11/07/2016 FINDINGS: Cardiomediastinal contours and hilar structures are normal. Lungs are clear. Visualized skeletal structures are unremarkable to the extent evaluated. IMPRESSION: No acute cardiopulmonary disease. Electronically Signed   By: Zetta Bills M.D.   On: 07/03/2019 10:26    Procedures Procedures (including critical care time)  Medications Ordered in ED Medications  naproxen (NAPROSYN) tablet 500 mg (500 mg Oral Given 07/03/19 1154)    ED Course  I have reviewed the triage vital signs and the nursing notes.  Pertinent labs & imaging results that were available during my care of the patient were reviewed by me and considered in my medical decision making (see chart for details).    MDM Rules/Calculators/A&P                          Patient's history and physical exam is most consistent with a musculoskeletal etiology/costochondritis.  Given that patient denies any increased physical exertion or activity, he admits to having a smoker's cough from his 40-pack-year smoking history and his left-sided  anterolateral chest wall tenderness that is reproducible with cough, deep inspiration, and palpation suggests musculoskeletal injury.  He denies any exertional component, nausea, diaphoresis, radiation, or shortness of breath.  There is no unilateral extremity edema on my examination and his only risk factor for a blood clot is his smoking history.  I cannot PERC out the patient due to his age, however he is 0 points with Wells criteria for pulmonary embolism.  I suspect that his troponin may have been mildly elevated or we could have possibly seen [id number]/right heart strain on EKG which would also have made me more strongly consider D-dimer or CTA.  He denies any recent surgeries  or immobilization.  His vital signs have been stable and WNL.  He is resting comfortably and in no acute distress.  Plain films obtained are personally reviewed and demonstrate no acute cardiopulmonary findings.  Initial troponin collected is WNL at 2, do not need to trend.  On reexamination, patient informs me that he often has very vigorous coughs associated with his smoking and he suspects that that may have been what triggered his left-sided anterolateral chest wall discomfort.  They simply wanted to have an evaluation to rule out more emergent processes.  They are both very reassured by today's work-up.  Will discharge home with a course of naproxen and encouraged him to follow-up with his primary care provider at [facility] for ongoing evaluation and management.  Strict ED return precautions discussed.  All of the evaluation and work-up results were discussed with the patient and any family at bedside. They were provided opportunity to ask any additional questions and have none at this time. They have expressed understanding of verbal discharge instructions as well as return precautions and are agreeable to the plan.    Final Clinical Impression(s) / ED Diagnoses Final diagnoses:  Chest wall pain    Rx / DC  Orders ED Discharge Orders    None       Elvera Maria 07/03/19 1237    Linwood Dibbles, MD 07/04/19 1007

## 2019-08-02 ENCOUNTER — Emergency Department (HOSPITAL_COMMUNITY): Payer: 59

## 2019-08-02 ENCOUNTER — Other Ambulatory Visit: Payer: Self-pay

## 2019-08-02 ENCOUNTER — Encounter (HOSPITAL_COMMUNITY): Payer: Self-pay

## 2019-08-02 ENCOUNTER — Observation Stay (HOSPITAL_COMMUNITY)
Admission: EM | Admit: 2019-08-02 | Discharge: 2019-08-03 | Disposition: A | Discharge status: Home / Self Care | Payer: 59 | Attending: Family Medicine | Admitting: Family Medicine

## 2019-08-02 DIAGNOSIS — Z20822 Contact with and (suspected) exposure to covid-19: Secondary | ICD-10-CM | POA: Diagnosis not present

## 2019-08-02 DIAGNOSIS — R2990 Unspecified symptoms and signs involving the nervous system: Secondary | ICD-10-CM

## 2019-08-02 DIAGNOSIS — I639 Cerebral infarction, unspecified: Secondary | ICD-10-CM | POA: Diagnosis not present

## 2019-08-02 DIAGNOSIS — E7800 Pure hypercholesterolemia, unspecified: Secondary | ICD-10-CM | POA: Diagnosis not present

## 2019-08-02 DIAGNOSIS — R29818 Other symptoms and signs involving the nervous system: Secondary | ICD-10-CM | POA: Diagnosis not present

## 2019-08-02 DIAGNOSIS — I63412 Cerebral infarction due to embolism of left middle cerebral artery: Secondary | ICD-10-CM | POA: Diagnosis not present

## 2019-08-02 DIAGNOSIS — I6389 Other cerebral infarction: Secondary | ICD-10-CM | POA: Diagnosis not present

## 2019-08-02 DIAGNOSIS — I6381 Other cerebral infarction due to occlusion or stenosis of small artery: Secondary | ICD-10-CM

## 2019-08-02 DIAGNOSIS — R609 Edema, unspecified: Secondary | ICD-10-CM | POA: Diagnosis not present

## 2019-08-02 DIAGNOSIS — R200 Anesthesia of skin: Secondary | ICD-10-CM | POA: Diagnosis not present

## 2019-08-02 DIAGNOSIS — R531 Weakness: Secondary | ICD-10-CM | POA: Diagnosis not present

## 2019-08-02 DIAGNOSIS — R202 Paresthesia of skin: Secondary | ICD-10-CM | POA: Insufficient documentation

## 2019-08-02 DIAGNOSIS — R9431 Abnormal electrocardiogram [ECG] [EKG]: Secondary | ICD-10-CM | POA: Diagnosis not present

## 2019-08-02 DIAGNOSIS — F17210 Nicotine dependence, cigarettes, uncomplicated: Secondary | ICD-10-CM | POA: Diagnosis not present

## 2019-08-02 DIAGNOSIS — Z79899 Other long term (current) drug therapy: Secondary | ICD-10-CM | POA: Diagnosis not present

## 2019-08-02 DIAGNOSIS — J3489 Other specified disorders of nose and nasal sinuses: Secondary | ICD-10-CM | POA: Diagnosis not present

## 2019-08-02 LAB — CBC
HCT: 47.7 % (ref 39.0–52.0)
Hemoglobin: 16.7 g/dL (ref 13.0–17.0)
MCH: 31.5 pg (ref 26.0–34.0)
MCHC: 35 g/dL (ref 30.0–36.0)
MCV: 89.8 fL (ref 80.0–100.0)
Platelets: 261 10*3/uL (ref 150–400)
RBC: 5.31 MIL/uL (ref 4.22–5.81)
RDW: 12.5 % (ref 11.5–15.5)
WBC: 5.9 10*3/uL (ref 4.0–10.5)
nRBC: 0 % (ref 0.0–0.2)

## 2019-08-02 LAB — BASIC METABOLIC PANEL
Anion gap: 9 (ref 5–15)
BUN: 12 mg/dL (ref 6–20)
CO2: 22 mmol/L (ref 22–32)
Calcium: 8.9 mg/dL (ref 8.9–10.3)
Chloride: 107 mmol/L (ref 98–111)
Creatinine, Ser: 1.06 mg/dL (ref 0.61–1.24)
GFR calc Af Amer: >60 mL/min (ref >60)
GFR calc non Af Amer: >60 mL/min (ref >60)
Glucose, Bld: 121 mg/dL — ABNORMAL HIGH (ref 70–99)
Potassium: 3.7 mmol/L (ref 3.5–5.1)
Sodium: 138 mmol/L (ref 135–145)

## 2019-08-02 LAB — LIPID PANEL
Cholesterol: 184 mg/dL (ref 0–200)
HDL: 43 mg/dL
LDL Cholesterol: 127 mg/dL — ABNORMAL HIGH (ref 0–99)
Total CHOL/HDL Ratio: 4.3 ratio
Triglycerides: 69 mg/dL
VLDL: 14 mg/dL (ref 0–40)

## 2019-08-02 LAB — URINALYSIS, ROUTINE W REFLEX MICROSCOPIC
Bilirubin Urine: NEGATIVE
Glucose, UA: NEGATIVE mg/dL
Hgb urine dipstick: NEGATIVE
Ketones, ur: NEGATIVE mg/dL
Leukocytes,Ua: NEGATIVE
Nitrite: NEGATIVE
Protein, ur: NEGATIVE mg/dL
Specific Gravity, Urine: 1.017 (ref 1.005–1.030)
pH: 5 (ref 5.0–8.0)

## 2019-08-02 LAB — CBG MONITORING, ED: Glucose-Capillary: 86 mg/dL (ref 70–99)

## 2019-08-02 LAB — SARS CORONAVIRUS 2 BY RT PCR (HOSPITAL ORDER, PERFORMED IN ~~LOC~~ HOSPITAL LAB): SARS Coronavirus 2: NEGATIVE

## 2019-08-02 MED ORDER — SODIUM CHLORIDE 0.9 % IV SOLN: INTRAVENOUS | Status: DC

## 2019-08-02 MED ORDER — GADOBUTROL 1 MMOL/ML IV SOLN
6.5000 mL | Freq: Once | INTRAVENOUS | Status: AC | PRN
  Administered 2019-08-02: 6.5 mL via INTRAVENOUS

## 2019-08-02 MED ORDER — STROKE: EARLY STAGES OF RECOVERY BOOK
Freq: Once | Status: AC
  Filled 2019-08-02: qty 1

## 2019-08-02 MED ORDER — ACETAMINOPHEN 650 MG RE SUPP: 650.0000 mg | RECTAL | Status: DC | PRN

## 2019-08-02 MED ORDER — ACETAMINOPHEN 160 MG/5ML PO SOLN: 650.0000 mg | ORAL | Status: DC | PRN

## 2019-08-02 MED ORDER — ASPIRIN 325 MG PO TABS
325.0000 mg | ORAL_TABLET | Freq: Every day | ORAL | Status: DC
  Administered 2019-08-03 (×2): 325 mg via ORAL
  Filled 2019-08-02 (×2): qty 1

## 2019-08-02 MED ORDER — SENNOSIDES-DOCUSATE SODIUM 8.6-50 MG PO TABS: 1.0000 | ORAL_TABLET | Freq: Every evening | ORAL | Status: DC | PRN

## 2019-08-02 MED ORDER — ACETAMINOPHEN 325 MG PO TABS: 650.0000 mg | ORAL_TABLET | ORAL | Status: DC | PRN

## 2019-08-02 MED ORDER — SODIUM CHLORIDE 0.9% FLUSH: 3.0000 mL | Freq: Once | INTRAVENOUS | Status: DC

## 2019-08-02 MED ORDER — ATORVASTATIN CALCIUM 80 MG PO TABS
80.0000 mg | ORAL_TABLET | Freq: Every day | ORAL | Status: DC
  Administered 2019-08-03 (×2): 80 mg via ORAL
  Filled 2019-08-02 (×2): qty 1

## 2019-08-02 NOTE — H&P (Signed)
History and Physical    Md Smola EPP:[id number] DOB: 12/25/1960 DOA: 08/02/2019  PCP: Tally Joe, MD   Patient coming from: Home  Chief Complaint: Weakness and altered feeling in left leg.  Difficulty walking.  HPI: Clinton Padilla is a 59 y.o. male with medical history significant for tobacco abuse no chronic medical problems, presented to the emergency room for evaluation of left-sided tingling and numbness. He reports that he was feeling in his normal state of health until around 1:30 AM on 08/02/2019 he began to have tingling and weakness in his left leg.Marland Kitchen  He did not make much of it and went to bed.  He woke up at around 10 AM with worsening tingling numbness and weakness of the left leg.  So noted going in left arm and on left side of his face.  Reports he did not have any slurred speech or drooping face.  States he was able to eat and drink normally.  Reports that when he tries to walk, he is able to walk but it feels like his left leg is going to buckle.  He does state he has seemed off balance and has difficulty coordinating his left leg. He also noticed some difficulty with dexterity on the left hand.  States he has not had any falls at home.  Denies any headaches or visual changes. Denies prior strokes. Has a family history of one sibling with myasthenia gravis and another sibling with multiple sclerosis. Denies chest pain, shortness of breath, nausea, vomiting, diarrhea, abdominal pain, urinary frequency, dysuria, tremors, fever, cough, rash.  ED Course: In the emergency room patient has a CT scan of his head which shows a small acute stroke at the lateral aspect of the thalamus.  ER provider consulted neurology, Dr. Laurence Slate, who is evaluated the patient and recommends patient be observed overnight and have further work-up.  Review of Systems:  General: Denies weakness, fever, chills, weight loss, night sweats.  Denies dizziness.  Denies change in appetite HENT: Denies head  trauma, headache, denies change in hearing, tinnitus.  Denies nasal congestion or bleeding.  Denies sore throat, sores in mouth.  Denies difficulty swallowing Eyes: Denies blurry vision, pain in eye, drainage.  Denies discoloration of eyes. Neck: Denies pain.  Denies swelling.  Denies pain with movement. Cardiovascular: Denies chest pain, palpitations.  Denies edema.  Denies orthopnea Respiratory: Denies shortness of breath, cough.  Denies wheezing.  Denies sputum production Gastrointestinal: Denies abdominal pain, swelling.  Denies nausea, vomiting, diarrhea.  Denies melena.  Denies hematemesis. Musculoskeletal: Denies limitation of movement.  Denies deformity or swelling.  Denies pain.  Denies arthralgias or myalgias. Genitourinary: Denies pelvic pain.  Denies urinary frequency or hesitancy.  Denies dysuria.  Skin: Denies rash.  Denies petechiae, purpura, ecchymosis. Neurological: Has leg paresthesia and weakness as above.  Has mild paresthesia of left hand and incoordination of left hand.  Denies headache.  Denies syncope.  Denies seizure activity.  Denies slurred speech, drooping face.  Denies visual change. Psychiatric: Denies depression, anxiety.  Denies suicidal thoughts or ideation.  Denies hallucinations.  History reviewed. No pertinent past medical history.  Past Surgical History:  Procedure Laterality Date  . ELBOW SURGERY    . LAPAROSCOPIC APPENDECTOMY N/A 12/05/2014   Procedure: APPENDECTOMY LAPAROSCOPIC;  Surgeon: Luretha Murphy, MD;  Location: [facility];  Service: General;  Laterality: N/A;    Social History  reports that he has been smoking cigarettes. He has never used smokeless tobacco. He reports that he does not drink  alcohol and does not use drugs.  No Known Allergies  History reviewed. No pertinent family history.   Prior to Admission medications   Medication Sig Start Date End Date Taking? Authorizing Provider  ibuprofen (ADVIL,MOTRIN) 200 MG tablet Take 400 mg by  mouth every 6 (six) hours as needed for fever, headache, moderate pain or cramping.   Yes [provider]  naproxen (NAPROSYN) 500 MG tablet Take 1 tablet (500 mg total) by mouth 2 (two) times daily as needed for moderate pain. 07/03/19  Yes Lorelee New, PA-C  acetaminophen (TYLENOL) 325 MG tablet Take 2 tablets (650 mg total) by mouth every 6 (six) hours as needed for mild pain, moderate pain, fever or headache. Patient not taking: Reported on 08/02/2019 12/06/14   Sherrie George, PA-C  HYDROcodone-acetaminophen (NORCO/VICODIN) 5-325 MG tablet Take 1-2 tablets by mouth every 4 (four) hours as needed for moderate pain. Patient not taking: Reported on 08/02/2019 12/06/14   Sherrie George, PA-C    Physical Exam: Vitals:   08/02/19 1159 08/02/19 1453 08/02/19 1657 08/02/19 1815  BP: (!) 144/94 123/87 (!) 125/97 125/89  Pulse: 100 65 74 (!) 58  Resp: 18 15 16 18   Temp: 98.6 F (37 C)     TempSrc: Oral     SpO2: 98% 99% 93% 94%    Constitutional: NAD, calm, comfortable Vitals:   08/02/19 1159 08/02/19 1453 08/02/19 1657 08/02/19 1815  BP: (!) 144/94 123/87 (!) 125/97 125/89  Pulse: 100 65 74 (!) 58  Resp: 18 15 16 18   Temp: 98.6 F (37 C)     TempSrc: Oral     SpO2: 98% 99% 93% 94%   General: WDWN, Alert and oriented x3.  Eyes: EOMI, PERRL, lids and conjunctivae normal.  Sclera nonicteric HENT:  [place]/AT, external ears normal.  Nares patent without epistasis.  Mucous membranes are moist. Posterior pharynx clear of any exudate or lesions. Normal dentition.  Neck: Soft, normal range of motion, supple, no masses, no thyromegaly.  Trachea midline Respiratory: clear to auscultation bilaterally, no wheezing, no crackles. Normal respiratory effort. No accessory muscle use.  Cardiovascular: Regular rate and rhythm, no murmurs / rubs / gallops. No extremity edema. 2+ pedal pulses. No carotid bruits.  Abdomen: Soft, no tenderness, nondistended, no rebound or guarding.  No masses  palpated. No hepatosplenomegaly. Bowel sounds normoactive Musculoskeletal: FROM. no clubbing / cyanosis. No joint deformity upper and lower extremities. Normal muscle tone.  Skin: Warm, dry, intact no rashes, lesions, ulcers. No induration Neurologic: CN 2-12 grossly intact.  Normal speech.  Sensation decreased in the left leg to light touch.  patella DTR +1 bilaterally. Strength 5/5 in upper extremities.  Strength 4-5 in left lower extremity: 5 out of 5 in right lower extremity.  Incoordination of the left heel knee shin.  Mild ataxia of finger-to-nose on the left, normal on right.  No facial droop noted Psychiatric: Normal judgment and insight.  Normal mood.    Labs on Admission: I have personally reviewed following labs and imaging studies  CBC: Recent Labs  Lab 08/02/19 1234  WBC 5.9  HGB 16.7  HCT 47.7  MCV 89.8  PLT 261    Basic Metabolic Panel: Recent Labs  Lab 08/02/19 1234  NA 138  K 3.7  CL 107  CO2 22  GLUCOSE 121*  BUN 12  CREATININE 1.06  CALCIUM 8.9    GFR: CrCl cannot be calculated (Unknown ideal weight.).  Liver Function Tests: No results for input(s): AST, ALT, ALKPHOS,  BILITOT, PROT, ALBUMIN in the last 168 hours.  Urine analysis:    Component Value Date/Time   COLORURINE YELLOW 08/02/2019 1800   APPEARANCEUR CLEAR 08/02/2019 1800   LABSPEC 1.017 08/02/2019 1800   PHURINE 5.0 08/02/2019 1800   GLUCOSEU NEGATIVE 08/02/2019 1800   HGBUR NEGATIVE 08/02/2019 1800   BILIRUBINUR NEGATIVE 08/02/2019 1800   KETONESUR NEGATIVE 08/02/2019 1800   PROTEINUR NEGATIVE 08/02/2019 1800   UROBILINOGEN 1.0 12/05/2014 0909   NITRITE NEGATIVE 08/02/2019 1800   LEUKOCYTESUR NEGATIVE 08/02/2019 1800    Radiological Exams on Admission: CT HEAD WO CONTRAST  Result Date: 08/02/2019 CLINICAL DATA:  Left-sided numbness EXAM: CT HEAD WITHOUT CONTRAST TECHNIQUE: Contiguous axial images were obtained from the base of the skull through the vertex without intravenous  contrast. COMPARISON:  None. FINDINGS: Brain: There is no acute intracranial hemorrhage, mass effect, or edema. Gray-white differentiation is preserved. Possible small subcentimeter focus of hypoattenuation at the lateral aspect of the right thalamus. There is no extra-axial fluid collection. Ventricles and sulci are within normal limits in size and configuration. Vascular: No hyperdense vessel or unexpected calcification. Skull: Calvarium is unremarkable. Sinuses/Orbits: Patchy mucosal thickening.  Orbits are unremarkable. Other: None. IMPRESSION: Possible small acute infarct at the lateral margin of the right thalamus. Consider MRI for further evaluation. Electronically Signed   By: Guadlupe SpanishPraneil  Patel M.D.   On: 08/02/2019 12:58    EKG: Independently reviewed.  EKG shows normal sinus rhythm.  No acute ST elevation or depression.  QTc 395  Assessment/Plan Principal Problem:   Acute CVA (cerebrovascular accident) [facility](HCC) Patient be observed on medical telemetry floor for TIA/CVA.   MRI of brain with and without contrast to rule out acute ischemic CVA versus demyelinating disease has been ordered in the emergency room.  Will evaluate carotids for large vessel occlusion/stenosis. MRI of the brain reveals demyelinating disease we will start high-dose steroids. Neurology has been consulted and will continue to follow patient. Obtain echocardiogram to evaluate for PFO, wall motion and ejection fraction. Hypertension of 220/110 will be allowed for 24 hours per stroke protocol.  After which blood pressure will be slowly reduced to goal level. Antiplatelet therapy with aspirin daily. Start statin therapy.  High-dose Lipitor has been shown in the [place] study to reduce the risk of recurrent stroke.  check lipid panel.  Neurochecks per stroke protocol    Tobacco use Smoking cessation education to be provided before discharge.  Health hazards of continued tobacco use including CAD, CVA, COPD, heart attack,  vascular disease, cancer, premature death reviewed with patient    DVT prophylaxis: SCDs for DVT prophylaxis.  Anticoagulation not provided to prevent conversion to hemorrhagic stroke Code Status:   Full code Family Communication:  Diagnosis plan discussed with patient.  Patient verbalized understanding agrees with plan.  Further recommendation to follow as clinical indicated  Disposition Plan:   Patient is from:  Home  Anticipated DC to:  Home  Anticipated DC date:  Anticipate discharge tomorrow -08/03/2019  Anticipated DC barriers: No barriers to discharge identified  Consults called:  Neurology-Dr. Aroor Admission status:  Observation  Severity of Illness: The appropriate patient status for this patient is OBSERVATION. Observation status is judged to be reasonable and necessary in order to provide the required intensity of service to ensure the patient's safety. The patient's presenting symptoms, physical exam findings, and initial radiographic and laboratory data in the context of their medical condition is felt to place them at decreased risk for further clinical deterioration. Furthermore, it is  anticipated that the patient will be medically stable for discharge from the hospital within 2 midnights of admission. The following factors support the patient status of observation.   " The patient's presenting symptoms include seizure left leg, weakness left leg, difficulty ambulating. " The physical exam findings include weakness of left leg.  Incoordination of left hand and left leg. " The initial radiographic and laboratory data are CT shows possible new stroke.  MRI brain pending.      Claudean Severance Haadi Santellan MD [facility]  How to contact the [facility] Attending or Consulting provider 7A - 7P or covering provider during after hours 7P -7A, for this patient?   1. Check the care team in [facility] and look for a) attending/consulting [facility] provider listed and b) the [facility] team listed 2. Log into  [email or web address] and use Welcome's universal password to access. If you do not have the password, please contact the hospital operator. 3. Locate the [facility] provider you are looking for under [facility] and page to a number that you can be directly reached. 4. If you still have difficulty reaching the provider, please page the [facility] (Director on Call) for the Hospitalists listed on amion for assistance.  08/02/2019, 8:18 PM

## 2019-08-02 NOTE — ED Notes (Signed)
Patient transported to MRI 

## 2019-08-02 NOTE — ED Provider Notes (Signed)
[name] Provider Note   CSN: [id number] Arrival date & time: 08/02/19  1152     History Chief Complaint  Patient presents with  . Weakness  . Numbness    Clinton Padilla is a 59 y.o. male presents for evaluation of acute onset, progressively worsening left-sided weakness.  He reports that symptoms began at 10 AM yesterday when he noted feeling of weakness to the left lower extremity.  He states that he felt that the extremity was heavy and that he was having a harder time ambulating.  This has since progressed and he is at a point where he feels as though his left lower extremity may buckle while he ambulates.  He went to sleep at 1 AM last night and awoke at 7 AM this morning to note paresthesias involving the entire left side from the face down to the lower extremity.  He also now notes dexterity issues involving the left hand and feels that he cannot grip things as easily or type as well.  Symptoms are constant, no aggravating or alleviating factors noted.  He denies vision changes, headache, nausea, vomiting, shortness of breath, chest pain.  He denies back pain, bowel or bladder incontinence, saddle anesthesia.  He is a current smoker of approximately 1 pack of cigarettes daily and drinks a liquor beverage twice a week.  Denies recreational drug use.  He has a sibling with history of MS and another sibling with a history of myasthenia gravis.  He took ibuprofen yesterday with no relief of symptoms.  The history is provided by the patient.       History reviewed. No pertinent past medical history.  Patient Active Problem List   Diagnosis Date Noted  . Acute CVA (cerebrovascular accident) (HCC) 08/02/2019  . Acute appendicitis 12/05/2014  . S/P laparoscopic appendectomy Nov 2016 12/05/2014    Past Surgical History:  Procedure Laterality Date  . ELBOW SURGERY    . LAPAROSCOPIC APPENDECTOMY N/A 12/05/2014   Procedure: APPENDECTOMY  LAPAROSCOPIC;  Surgeon: Luretha Murphy, MD;  Location: [facility];  Service: General;  Laterality: N/A;       History reviewed. No pertinent family history.  Social History   Tobacco Use  . Smoking status: Current Every Day Smoker    Types: Cigarettes  . Smokeless tobacco: Never Used  Substance Use Topics  . Alcohol use: No    Comment: social   . Drug use: Never    Home Medications Prior to Admission medications   Medication Sig Start Date End Date Taking? Authorizing Provider  ibuprofen (ADVIL,MOTRIN) 200 MG tablet Take 400 mg by mouth every 6 (six) hours as needed for fever, headache, moderate pain or cramping.   Yes [provider]  naproxen (NAPROSYN) 500 MG tablet Take 1 tablet (500 mg total) by mouth 2 (two) times daily as needed for moderate pain. 07/03/19  Yes Lorelee New, PA-C  acetaminophen (TYLENOL) 325 MG tablet Take 2 tablets (650 mg total) by mouth every 6 (six) hours as needed for mild pain, moderate pain, fever or headache. Patient not taking: Reported on 08/02/2019 12/06/14   Sherrie George, PA-C  HYDROcodone-acetaminophen (NORCO/VICODIN) 5-325 MG tablet Take 1-2 tablets by mouth every 4 (four) hours as needed for moderate pain. Patient not taking: Reported on 08/02/2019 12/06/14   Sherrie George, PA-C    Allergies    Patient has no known allergies.  Review of Systems   Review of Systems  Constitutional: Negative for chills and fever.  Eyes: Negative for visual disturbance.  Respiratory: Negative for shortness of breath.   Cardiovascular: Negative for chest pain.  Gastrointestinal: Negative for abdominal pain, nausea and vomiting.  Musculoskeletal: Negative for back pain.  Neurological: Positive for weakness and numbness. Negative for dizziness, syncope and headaches.  All other systems reviewed and are negative.   Physical Exam Updated Vital Signs BP 109/83   Pulse (!) 55   Temp 98.6 F (37 C) (Oral)   Resp 15   SpO2 97%   Physical  Exam Vitals and nursing note reviewed.  Constitutional:      General: He is not in acute distress.    Appearance: He is well-developed.  HENT:     Head: Normocephalic and atraumatic.  Eyes:     General:        Right eye: No discharge.        Left eye: No discharge.     Conjunctiva/sclera: Conjunctivae normal.  Neck:     Vascular: No JVD.     Trachea: No tracheal deviation.  Cardiovascular:     Rate and Rhythm: Normal rate.  Pulmonary:     Effort: Pulmonary effort is normal.  Abdominal:     General: There is no distension.  Skin:    General: Skin is warm and dry.     Findings: No erythema.  Neurological:     Mental Status: He is alert.     Comments: Mental Status:  Alert, thought content appropriate, able to give a coherent history. Speech fluent without evidence of aphasia. Able to follow 2 step commands without difficulty.  Cranial Nerves:  II:  Peripheral visual fields grossly normal, pupils equal, round, reactive to light III,IV, VI: ptosis not present, extra-ocular motions intact bilaterally  V,VII: smile symmetric, facial light touch sensation equal VIII: hearing grossly normal to voice  X: uvula elevates symmetrically  XI: bilateral shoulder shrug symmetric and strong XII: midline tongue extension without fassiculations Motor:  Normal tone. 5/5 strength of BUE and BLE major muscle groups including strong and equal grip strength and dorsiflexion/plantar flexion Sensory: light touch normal in all extremities. DTRs: biceps and achilles 2+ symmetric b/l Cerebellar: Dysmetria with finger-to-nose of the left upper extremity and with heel-to-shin of the left lower extremity gait: Ambulatory with somewhat unsteady gait, left lower extremity occasionally buckles out from under him while ambulating.  Psychiatric:        Behavior: Behavior normal.     ED Results / Procedures / Treatments   Labs (all labs ordered are listed, but only abnormal results are displayed) Labs  Reviewed  BASIC METABOLIC PANEL - Abnormal; Notable for the following components:      Result Value   Glucose, Bld 121 (*)    All other components within normal limits  SARS CORONAVIRUS 2 BY RT PCR (HOSPITAL ORDER, PERFORMED IN [facility])  CBC  URINALYSIS, ROUTINE W REFLEX MICROSCOPIC  HIV ANTIBODY (ROUTINE TESTING W REFLEX)  HEMOGLOBIN A1C  LIPID PANEL  CBG MONITORING, ED    EKG EKG Interpretation  Date/Time:  Monday August 02 2019 15:26:19 EDT Ventricular Rate:  62 PR Interval:  126 QRS Duration: 88 QT Interval:  390 QTC Calculation: 395 R Axis:   102 Text Interpretation: Normal sinus rhythm with sinus arrhythmia Rightward axis Borderline ECG Confirmed by Kennis CarinaBero, Michael [phone]([id number]) on 08/02/2019 5:58:49 PM   Radiology CT HEAD WO CONTRAST  Result Date: 08/02/2019 CLINICAL DATA:  Left-sided numbness EXAM: CT HEAD WITHOUT CONTRAST TECHNIQUE: Contiguous axial images were  obtained from the base of the skull through the vertex without intravenous contrast. COMPARISON:  None. FINDINGS: Brain: There is no acute intracranial hemorrhage, mass effect, or edema. Gray-white differentiation is preserved. Possible small subcentimeter focus of hypoattenuation at the lateral aspect of the right thalamus. There is no extra-axial fluid collection. Ventricles and sulci are within normal limits in size and configuration. Vascular: No hyperdense vessel or unexpected calcification. Skull: Calvarium is unremarkable. Sinuses/Orbits: Patchy mucosal thickening.  Orbits are unremarkable. Other: None. IMPRESSION: Possible small acute infarct at the lateral margin of the right thalamus. Consider MRI for further evaluation. Electronically Signed   By: Guadlupe Spanish M.D.   On: 08/02/2019 12:58    Procedures Procedures (including critical care time)  Medications Ordered in ED Medications  sodium chloride flush (NS) 0.9 % injection 3 mL (3 mLs Intravenous Not Given 08/02/19 1744)   stroke: mapping our  early stages of recovery book (has no administration in time range)  0.9 %  sodium chloride infusion (has no administration in time range)  acetaminophen (TYLENOL) tablet 650 mg (has no administration in time range)    Or  acetaminophen (TYLENOL) 160 MG/5ML solution 650 mg (has no administration in time range)    Or  acetaminophen (TYLENOL) suppository 650 mg (has no administration in time range)  senna-docusate (Senokot-S) tablet 1 tablet (has no administration in time range)  aspirin tablet 325 mg (has no administration in time range)  atorvastatin (LIPITOR) tablet 80 mg (has no administration in time range)  gadobutrol (GADAVIST) 1 MMOL/ML injection 6.5 mL (6.5 mLs Intravenous Contrast Given 08/02/19 1955)    ED Course  I have reviewed the triage vital signs and the nursing notes.  Pertinent labs & imaging results that were available during my care of the patient were reviewed by me and considered in my medical decision making (see chart for details).    MDM Rules/Calculators/A&P                          Patient presenting for evaluation of progressive left-sided weakness since 10 AM yesterday, paresthesias noted today upon awakening at 7 AM.  The patient is afebrile, vital signs are stable.  He is nontoxic in appearance.  He exhibits left-sided paresthesias, altered sensation to light touch the left side of the face and extremities.  Also exhibits dysmetria with finger-to-nose and heel-to-shin on the left side.  Clinical picture is concerning for acute stroke but he does have a family history of a sibling with MS and another with myasthenia gravis.  Lab work reviewed and interpreted by myself shows no leukocytosis, no anemia, no metabolic derangements, no renal insufficiency.  EKG shows normal sinus rhythm with no acute ischemic abnormalities.  Head CT is concerning for possible small acute infarct at the lateral margin of the right thalamus.  6:15PM CONSULT: Spoke with Dr. Wilford Corner with  neurology.  He has seen and evaluated the patient emergently in the ED.  He recommends obtaining MRI brain with and without contrast for further evaluation and to rule out potential MS lesion.  If the MRI is concerning for stroke then he will need to be admitted and managed for this, otherwise if concerning for MS that he will need to be admitted for high-dose IV steroids.  MRI shows 1 cm acute infarct right lateral thalamus and posterior limb internal capsule.  No significant chronic ischemic changes noted on MRI.  Spoke with Dr. Rachael Darby with [facility] who  agrees to assume care of patient and bring him into the hospital for further evaluation and management.  Final Clinical Impression(s) / ED Diagnoses Final diagnoses:  Thalamic stroke [facility])    Rx / DC Orders ED Discharge Orders    None       Bennye Alm 08/02/19 2255    Sabas Sous, MD 08/03/19 0010

## 2019-08-02 NOTE — ED Notes (Signed)
RN assisted pt w/ ambulation to the bathroom. Pt was unsteady on feet, kept leaning to L side. Pt assisted pt back to bed, pt does not have a drift of L arm or leg

## 2019-08-02 NOTE — Consult Note (Signed)
Neurology Consultation  Reason for Consult: Weakness, tingling-left side Referring Physician: Dr. Kennis Carina  CC: Left-sided tingling and weakness  History is obtained from: Patient, chart  HPI: Clinton Padilla is a 59 y.o. male past medical history of tobacco abuse, presented to the emergency room for evaluation of left-sided tingling and numbness. He was last normal at around 1:30 AM on 08/02/2019, and just after that noticed left leg being tingly.  He did not make much of it and went to bed.  He woke up at 10 AM with some tingling numbness and weakness of the left leg along with left arm and also left side of his face. He notes that when he tries to walk, he is able to walk but it feels like his left leg is going to buckle.  He also noticed some difficulty with dexterity on the left hand. Denies any headaches or visual changes. Denies prior strokes. Has a family history of one sibling with myasthenia gravis and another sibling with multiple sclerosis. Denies chest pain shortness of breath nausea vomiting.  In the emergency room, evaluated for the symptoms with a CT head that shows a possible small lacunar infarct in the right thalamic capsular junction.   LKW: 0130 hrs. tpa given?: no, OSW for tPA Premorbid modified Rankin scale (mRS):0  ROS: Obtained and negative except noted in HPI   History reviewed. No pertinent past medical history.  No family history on file.  Social History:   reports that he has been smoking cigarettes. He does not have any smokeless tobacco history on file. He reports that he does not drink alcohol and does not use drugs.  Medications  Current Facility-Administered Medications:  .  sodium chloride flush (NS) 0.9 % injection 3 mL, 3 mL, Intravenous, Once, Bero, Elmer Sow, MD  Current Outpatient Medications:  .  acetaminophen (TYLENOL) 325 MG tablet, Take 2 tablets (650 mg total) by mouth every 6 (six) hours as needed for mild pain, moderate pain,  fever or headache., Disp: , Rfl:  .  HYDROcodone-acetaminophen (NORCO/VICODIN) 5-325 MG tablet, Take 1-2 tablets by mouth every 4 (four) hours as needed for moderate pain., Disp: 40 tablet, Rfl: 0 .  ibuprofen (ADVIL,MOTRIN) 200 MG tablet, Take 400 mg by mouth every 6 (six) hours as needed for fever, headache, moderate pain or cramping., Disp: , Rfl:  .  naproxen (NAPROSYN) 500 MG tablet, Take 1 tablet (500 mg total) by mouth 2 (two) times daily as needed for moderate pain., Disp: 30 tablet, Rfl: 0  Exam: Current vital signs: BP 125/89 (BP Location: Right Arm)   Pulse (!) 58   Temp 98.6 F (37 C) (Oral)   Resp 18   SpO2 94%  Vital signs in last 24 hours: Temp:  [98.6 F (37 C)] 98.6 F (37 C) (07/12 1159) Pulse Rate:  [58-100] 58 (07/12 1815) Resp:  [15-18] 18 (07/12 1815) BP: (123-144)/(87-97) 125/89 (07/12 1815) SpO2:  [93 %-99 %] 94 % (07/12 1815) GENERAL: Awake, alert in NAD HEENT: - Normocephalic and atraumatic, dry mm, no LN++, no Thyromegally LUNGS - Clear to auscultation bilaterally with no wheezes CV - S1S2 RRR, no m/r/g, equal pulses bilaterally. ABDOMEN - Soft, nontender, nondistended with normoactive BS Ext: warm, well perfused, intact peripheral pulses, no edema NEURO:  Mental Status: AA&Ox3  Language: speech is clear.  Naming, repetition, fluency, and comprehension intact. Cranial Nerves: PERRL. EOMI, visual fields full, no facial asymmetry, facial sensation intact, hearing intact, tongue/uvula/soft palate midline, normal  sternocleidomastoid and  trapezius muscle strength. No evidence of tongue atrophy or fibrillations Motor: 4+/5 LUE, otherwise 5/5 all over Tone: is normal and bulk is normal Sensation-diminished on the left to all modalities Coordination: Ataxic finger-nose-finger on the left and mild ataxia to heel-knee-shin on the left.  Right side intact Gait- deferred  NIHSS-3   Labs I have reviewed labs in epic and the results pertinent to this  consultation are:  CBC    Component Value Date/Time   WBC 5.9 08/02/2019 1234   RBC 5.31 08/02/2019 1234   HGB 16.7 08/02/2019 1234   HCT 47.7 08/02/2019 1234   PLT 261 08/02/2019 1234   MCV 89.8 08/02/2019 1234   MCH 31.5 08/02/2019 1234   MCHC 35.0 08/02/2019 1234   RDW 12.5 08/02/2019 1234    CMP     Component Value Date/Time   NA 138 08/02/2019 1234   K 3.7 08/02/2019 1234   CL 107 08/02/2019 1234   CO2 22 08/02/2019 1234   GLUCOSE 121 (H) 08/02/2019 1234   BUN 12 08/02/2019 1234   CREATININE 1.06 08/02/2019 1234   CALCIUM 8.9 08/02/2019 1234   PROT 7.3 12/05/2014 0859   ALBUMIN 3.9 12/05/2014 0859   AST 13 (L) 12/05/2014 0859   ALT 11 (L) 12/05/2014 0859   ALKPHOS 85 12/05/2014 0859   BILITOT 1.6 (H) 12/05/2014 0859   GFRNONAA >60 08/02/2019 1234   GFRAA >60 08/02/2019 1234   Imaging I have reviewed the images obtained:  CT-scan of the brain-possible small acute infarct of the lateral margin of right thalamus in the thalamic capsular region.  Assessment:  59 year old with past history of tobacco abuse, presenting for sudden onset of left-sided tingling numbness and weakness. On examination he has mild left upper extremity weakness, diminished sensation left hemibody and left-sided ataxia. CT head concerning for possible small thalamic capsular infarct on the left. Has a family history of MS in one of the siblings. Sudden onset of symptoms goes against MS. Sudden onset of symptoms more likely in favor of stroke.  Impression: Evaluate for stroke versus demyelinating process.  Recommendations: Admit for observation MRI brain with and without contrast Order stroke work-up with MRI as consistent with a stroke. If the MRI is more consistent with a demyelinating process, will need high-dose steroids. Further recommendations based on imaging studies. Plan was discussed with ED providers.  -- Milon Dikes, MD [facility] Pager: [phone] If  7pm to 7am, please call on call as listed on AMION.

## 2019-08-02 NOTE — ED Triage Notes (Signed)
Pt presents with waking yesterday morning approx 10 am with weakness to his Left leg, woke this am with numbness to Left side of face, Left arm feels funny and Left leg weaker than yesterday. LKW 0130 Monday morning (7/11)

## 2019-08-02 NOTE — Progress Notes (Addendum)
MRI completed. I have personally reviewed the images. Acute right thalamic lesion is seen on DWI, appearing most consistent with an acute ischemic infarction. The size and location of the stroke appear are suggestive of chronic small vessel disease as the underlying etiology .  Will need full stroke work up, to include CTA of head and neck, TTE and cardiac telemetry. Out of permissive HTN time window. Has been started on ASA 325 mg po qd.   Electronically signed: Dr. Caryl Pina

## 2019-08-03 ENCOUNTER — Other Ambulatory Visit: Payer: Self-pay | Admitting: Neurology

## 2019-08-03 ENCOUNTER — Observation Stay (HOSPITAL_COMMUNITY): Payer: 59

## 2019-08-03 ENCOUNTER — Observation Stay (HOSPITAL_BASED_OUTPATIENT_CLINIC_OR_DEPARTMENT_OTHER): Payer: 59

## 2019-08-03 DIAGNOSIS — Z20822 Contact with and (suspected) exposure to covid-19: Secondary | ICD-10-CM | POA: Diagnosis not present

## 2019-08-03 DIAGNOSIS — I639 Cerebral infarction, unspecified: Secondary | ICD-10-CM | POA: Diagnosis not present

## 2019-08-03 DIAGNOSIS — F17210 Nicotine dependence, cigarettes, uncomplicated: Secondary | ICD-10-CM | POA: Diagnosis not present

## 2019-08-03 DIAGNOSIS — I6389 Other cerebral infarction: Secondary | ICD-10-CM | POA: Diagnosis not present

## 2019-08-03 DIAGNOSIS — Z79899 Other long term (current) drug therapy: Secondary | ICD-10-CM | POA: Diagnosis not present

## 2019-08-03 DIAGNOSIS — R531 Weakness: Secondary | ICD-10-CM | POA: Diagnosis not present

## 2019-08-03 DIAGNOSIS — I63311 Cerebral infarction due to thrombosis of right middle cerebral artery: Secondary | ICD-10-CM

## 2019-08-03 DIAGNOSIS — E7800 Pure hypercholesterolemia, unspecified: Secondary | ICD-10-CM | POA: Diagnosis not present

## 2019-08-03 DIAGNOSIS — R202 Paresthesia of skin: Secondary | ICD-10-CM | POA: Diagnosis not present

## 2019-08-03 LAB — ECHOCARDIOGRAM COMPLETE

## 2019-08-03 LAB — RAPID URINE DRUG SCREEN, HOSP PERFORMED
Amphetamines: NOT DETECTED
Barbiturates: NOT DETECTED
Benzodiazepines: NOT DETECTED
Cocaine: NOT DETECTED
Opiates: NOT DETECTED
Tetrahydrocannabinol: POSITIVE — AB

## 2019-08-03 LAB — HIV ANTIBODY (ROUTINE TESTING W REFLEX): HIV Screen 4th Generation wRfx: NONREACTIVE

## 2019-08-03 LAB — HEMOGLOBIN A1C
Hgb A1c MFr Bld: 5.5 % (ref 4.8–5.6)
Mean Plasma Glucose: 111.15 mg/dL

## 2019-08-03 MED ORDER — ATORVASTATIN CALCIUM 40 MG PO TABS: 40.0000 mg | ORAL_TABLET | Freq: Every day | ORAL | 0 refills | Status: DC

## 2019-08-03 MED ORDER — ASPIRIN EC 81 MG PO TBEC: 81.0000 mg | DELAYED_RELEASE_TABLET | Freq: Every day | ORAL | Status: DC

## 2019-08-03 MED ORDER — ASPIRIN 81 MG PO TBEC: 81.0000 mg | DELAYED_RELEASE_TABLET | Freq: Every day | ORAL | 0 refills | Status: AC

## 2019-08-03 MED ORDER — CLOPIDOGREL BISULFATE 75 MG PO TABS
75.0000 mg | ORAL_TABLET | Freq: Every day | ORAL | Status: DC
  Administered 2019-08-03: 75 mg via ORAL
  Filled 2019-08-03: qty 1

## 2019-08-03 MED ORDER — CLOPIDOGREL BISULFATE 75 MG PO TABS: 75.0000 mg | ORAL_TABLET | Freq: Every day | ORAL | 0 refills | Status: AC

## 2019-08-03 MED ORDER — ATORVASTATIN CALCIUM 40 MG PO TABS: 40.0000 mg | ORAL_TABLET | Freq: Every day | ORAL | Status: DC

## 2019-08-03 NOTE — Evaluation (Signed)
Physical Therapy Evaluation Patient Details Name: Clinton Padilla MRN: [id number] DOB: August 04, 1960 Today's Date: 08/03/2019   History of Present Illness  59 yo male with onset of L side tingling and numbness was admitted, note his MRI found R lateral thalamic and posterior limb internal capsule infarcts.  MRA had no acute findings.  PMHx:  tobacco use, appendectomy,   Clinical Impression  Pt is seen for mobility with use of IV pole, but due to his decision to get home is then assessed with walker.  Talked with his wife about the instability even with walker, and will need to be supported and assisted at home with his family.  Pt is encouraged to let MD know if he changes his mind about the discharge plans, but for now home therapy will follow up and then would recommend outpatient therapy to make more defined LLE control of balance.  Pt is encouraged not to walk alone, and will receive a RW prior to discharge.    Follow Up Recommendations CIR    Equipment Recommendations  Rolling walker with 5" wheels    Recommendations for Other Services Rehab consult     Precautions / Restrictions Precautions Precautions: Fall Precaution Comments: LLE incoordination Restrictions Weight Bearing Restrictions: No      Mobility  Bed Mobility Overal bed mobility: Modified Independent                Transfers Overall transfer level: Modified independent Equipment used: None             General transfer comment: reminders for his hand placement for safety  Ambulation/Gait Ambulation/Gait assistance: Min guard;Min assist Gait Distance (Feet): 400 Feet (220 + 180) Assistive device: Rolling walker (2 wheeled);1 person hand held assist;IV Pole Gait Pattern/deviations: Step-to pattern;Step-through pattern;Decreased stride length;Wide base of support;Decreased weight shift to left;Staggering right Gait velocity: redcued Gait velocity interpretation: <1.31 ft/sec, indicative of household  ambulator General Gait Details: pt loses control of LLE placement of foot and then corrects to R side quickly  Stairs Stairs: Yes Stairs assistance: Min assist Stair Management: One rail Right;One rail Left;Alternating pattern;Step to pattern Number of Stairs: 4 General stair comments: pt is struggling with controlling LLE to step, needs extra time and support to place LLE on step and maintain balance  Wheelchair Mobility    Modified Rankin (Stroke Patients Only) Modified Rankin (Stroke Patients Only) Pre-Morbid Rankin Score: No symptoms Modified Rankin: Moderate disability     Balance Overall balance assessment: Needs assistance Sitting-balance support: Feet supported Sitting balance-Leahy Scale: Good Sitting balance - Comments: good, able to cross legs to don his shoes Postural control: Right lateral lean Standing balance support: Bilateral upper extremity supported;During functional activity Standing balance-Leahy Scale: Fair Standing balance comment: fair with static and with dynamic is poor               High Level Balance Comments: catches his balance with steps on LLE at times, including buckling              Pertinent Vitals/Pain Pain Assessment: No/denies pain    Home Living Family/patient expects to be discharged to:: Private residence Living Arrangements: Spouse/significant other;Children Available Help at Discharge: Family Type of Home: House Home Access: Stairs to enter Entrance Stairs-Rails: Right Entrance Stairs-Number of Steps: 5 Home Layout: One level Home Equipment: None      Prior Function Level of Independence: Independent         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  Hand Dominance   Dominant Hand: Right    Extremity/Trunk Assessment   Upper Extremity Assessment Upper Extremity Assessment: Overall WFL for tasks assessed (noted LUE was flexed in his side for a time)    Lower Extremity Assessment Lower Extremity  Assessment: LLE deficits/detail LLE Deficits / Details: DF is 4- to 4, has moderate incoordination of LLE LLE Coordination: decreased gross motor    Cervical / Trunk Assessment Cervical / Trunk Assessment: Normal  Communication   Communication: No difficulties  Cognition Arousal/Alertness: Awake/alert Behavior During Therapy: Impulsive (at times) Overall Cognitive Status: Impaired/Different from baseline Area of Impairment: Attention;Safety/judgement;Problem solving                   Current Attention Level: Selective     Safety/Judgement: Decreased awareness of safety;Decreased awareness of deficits   Problem Solving: Slow processing;Requires tactile cues;Requires verbal cues General Comments: elements of hurry to walking and stairs, requires close monitoring      General Comments General comments (skin integrity, edema, etc.): pt is moderately uncoordinated with LLE in testing foot taps, small marches    Exercises     Assessment/Plan    PT Assessment Patient needs continued PT services  PT Problem List Decreased strength;Decreased activity tolerance;Decreased balance;Decreased coordination;Decreased knowledge of use of DME       PT Treatment Interventions DME instruction;Gait training;Stair training;Functional mobility training;Therapeutic activities;Therapeutic exercise;Balance training;Neuromuscular re-education;Patient/family education    PT Goals (Current goals can be found in the Care Plan section)  Acute Rehab PT Goals Patient Stated Goal: to get directly home PT Goal Formulation: With patient/family Time For Goal Achievement: 08/17/19 Potential to Achieve Goals: Good    Frequency Min 5X/week   Barriers to discharge Inaccessible home environment;Decreased caregiver support home with his children in staired environment    Co-evaluation               AM-PAC PT "6 Clicks" Mobility  Outcome Measure Help needed turning from your back to your  side while in a flat bed without using bedrails?: None Help needed moving from lying on your back to sitting on the side of a flat bed without using bedrails?: A Little Help needed moving to and from a bed to a chair (including a wheelchair)?: A Little Help needed standing up from a chair using your arms (e.g., wheelchair or bedside chair)?: A Little Help needed to walk in hospital room?: A Little Help needed climbing 3-5 steps with a railing? : A Little 6 Click Score: 19    End of Session Equipment Utilized During Treatment: Gait belt Activity Tolerance: Patient limited by fatigue;Treatment limited secondary to medical complications (Comment) Patient left: in bed;with call bell/phone within reach;with family/visitor present Nurse Communication: Mobility status PT Visit Diagnosis: Unsteadiness on feet (R26.81);Muscle weakness (generalized) (M62.81);Ataxic gait (R26.0)    Time: 2979-8921 PT Time Calculation (min) (ACUTE ONLY): 39 min   Charges:   PT Evaluation $PT Eval Moderate Complexity: 1 Mod PT Treatments $Gait Training: 23-37 mins       Ivar Drape 08/03/2019, 2:12 PM  Samul Dada, PT MS Acute Rehab Dept. Number: [facility] [id number] and [facility] [phone]

## 2019-08-03 NOTE — ED Notes (Signed)
Pt transported to MRI 

## 2019-08-03 NOTE — Progress Notes (Signed)
Pt alert and oriented x4. Stable at discharge, vitals WDL. Pt given discharge instructions and he verbalized understanding. Ambulatory at discharge with assistive device. PT transported home by wife via POV.

## 2019-08-03 NOTE — Progress Notes (Signed)
STROKE TEAM PROGRESS NOTE   INTERVAL HISTORY His wife is at the bedside.  Pt sitting in bed, stated much improved on the left side numbness and weakness. However, he still has mild ataxia on FTN on the left. He is willing to quit smoking. He stated that he had bouts of cough 2 days ago and started to have left chest numbness, then numbness went to his left leg later that day and the second day he had also left arm numbness.  Vitals:   08/03/19 0545 08/03/19 0600 08/03/19 0700 08/03/19 0845  BP: 116/75 124/82 121/77 115/78  Pulse: (!) 51 (!) 52 (!) 57 62  Resp: 13 14 15 19   Temp:      TempSrc:      SpO2: 94% 94% 92% 96%   CBC:  Recent Labs  Lab 08/02/19 1234  WBC 5.9  HGB 16.7  HCT 47.7  MCV 89.8  PLT 261   Basic Metabolic Panel:  Recent Labs  Lab 08/02/19 1234  NA 138  K 3.7  CL 107  CO2 22  GLUCOSE 121*  BUN 12  CREATININE 1.06  CALCIUM 8.9   Lipid Panel:  Recent Labs  Lab 08/02/19 2253  CHOL 184  TRIG 69  HDL 43  CHOLHDL 4.3  VLDL 14  LDLCALC 10/03/19*   HgbA1c:  Recent Labs  Lab 08/02/19 1234  HGBA1C 5.5   Urine Drug Screen: No results for input(s): LABOPIA, COCAINSCRNUR, LABBENZ, AMPHETMU, THCU, LABBARB in the last 168 hours.  Alcohol Level No results for input(s): ETH in the last 168 hours.  IMAGING past 24 hours CT HEAD WO CONTRAST  Result Date: 08/02/2019 CLINICAL DATA:  Left-sided numbness EXAM: CT HEAD WITHOUT CONTRAST TECHNIQUE: Contiguous axial images were obtained from the base of the skull through the vertex without intravenous contrast. COMPARISON:  None. FINDINGS: Brain: There is no acute intracranial hemorrhage, mass effect, or edema. Gray-white differentiation is preserved. Possible small subcentimeter focus of hypoattenuation at the lateral aspect of the right thalamus. There is no extra-axial fluid collection. Ventricles and sulci are within normal limits in size and configuration. Vascular: No hyperdense vessel or unexpected calcification.  Skull: Calvarium is unremarkable. Sinuses/Orbits: Patchy mucosal thickening.  Orbits are unremarkable. Other: None. IMPRESSION: Possible small acute infarct at the lateral margin of the right thalamus. Consider MRI for further evaluation. Electronically Signed   By: 10/03/2019 M.D.   On: 08/02/2019 12:58   MR Brain W and Wo Contrast  Result Date: 08/02/2019 CLINICAL DATA:  Focal neuro deficit rule out stroke. EXAM: MRI HEAD WITHOUT AND WITH CONTRAST TECHNIQUE: Multiplanar, multiecho pulse sequences of the brain and surrounding structures were obtained without and with intravenous contrast. CONTRAST:  6.73mL GADAVIST GADOBUTROL 1 MMOL/ML IV SOLN COMPARISON:  CT head 08/02/2019 FINDINGS: Brain: Small acute infarct right lateral thalamus and posterior limb internal capsule measuring up to 10 mm in diameter. No other acute infarct. No chronic ischemic change. Normal ventricular size. Negative for hemorrhage or mass. Normal enhancement. Vascular: Normal arterial flow voids. Skull and upper cervical spine: No focal skeletal lesion. Sinuses/Orbits: Moderate mucosal edema paranasal sinuses. Negative orbit Other: None IMPRESSION: 1 cm acute infarct right lateral thalamus and posterior limb internal capsule. No significant chronic ischemic change. Electronically Signed   By: 10/03/2019 M.D.   On: 08/02/2019 20:16    PHYSICAL EXAM  Temp:  [98.6 F (37 C)] 98.6 F (37 C) (07/12 1159) Pulse Rate:  [42-100] 46 (07/13 1022) Resp:  [13-22] 16 (07/13  1022) BP: (109-144)/(73-97) 120/78 (07/13 1022) SpO2:  [92 %-99 %] 96 % (07/13 1022)  General - Well nourished, well developed, in no apparent distress.  Ophthalmologic - fundi not visualized due to noncooperation.  Cardiovascular - Regular rhythm and rate.  Mental Status -  Level of arousal and orientation to time, place, and person were intact. Language including expression, naming, repetition, comprehension was assessed and found intact. Attention  span and concentration were normal. Fund of Knowledge was assessed and was intact.  Cranial Nerves II - XII - II - Visual field intact OU. III, IV, VI - Extraocular movements intact. V - Facial sensation intact bilaterally. VII - Facial movement intact bilaterally. VIII - Hearing & vestibular intact bilaterally. X - Palate elevates symmetrically. XI - Chin turning & shoulder shrug intact bilaterally. XII - Tongue protrusion intact.  Motor Strength - The patient's strength was normal in all extremities and pronator drift was absent.  Bulk was normal and fasciculations were absent.   Motor Tone - Muscle tone was assessed at the neck and appendages and was normal.  Reflexes - The patient's reflexes were symmetrical in all extremities and he had no pathological reflexes.  Sensory - Light touch, temperature/pinprick were assessed and were symmetrical.    Coordination - The patient had normal movements in the right hands with no ataxia or dysmetria. However, left hand FTN mild ataxia. Tremor was absent.  Gait and Station - deferred.   ASSESSMENT/PLAN [name] is a 59 y.o. male with history of tobacco use presenting with L arm, leg and face tingling and numbness.   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  LDL 127  HgbA1c 5.5  VTE prophylaxis - SCDs  No antithrombotic prior to admission, now on aspirin 81 mg daily and clopidogrel 75 mg daily DAPT for 3 weeks and then ASA alone.  Therapy recommendations:  pending  Disposition:  pending  Hyperlipidemia  Home meds:  No statin  Now on lipitor 40   LDL 127, goal < 70  Continue statin at discharge  Tobacco abuse  Current  smoker  Smoking cessation counseling provided  Pt is willing to quit  Other Stroke Risk Factors     Other Active Problems    Hospital day # 0  Neurology will sign off. Please call with questions. Pt will follow up with stroke clinic NP at [facility] in about 4 weeks. Thanks for the consult.  Marvel Plan, MD PhD Stroke Neurology 08/03/2019 10:50 AM   To contact Stroke Continuity provider, please refer to [email or web address]. After hours, contact General Neurology

## 2019-08-03 NOTE — Progress Notes (Signed)
Carotid duplex has been completed.   Preliminary results in CV Proc.   Blanch Media 08/03/2019 9:52 AM

## 2019-08-03 NOTE — Discharge Summary (Addendum)
Physician Discharge Summary  Clinton Padilla UJW:[id number] DOB: 24-Aug-1960 DOA: 08/02/2019  PCP: Tally Joe, MD  Admit date: 08/02/2019 Discharge date: 08/03/2019  Admitted From: Home Disposition: Home  Recommendations for Outpatient Follow-up:  1. Follow up with PCP in 1-2 weeks 2. Follow with neurology per their schedule date 3. Please obtain BMP/CBC in one week 4. Please follow up with your PCP on the following pending results: Unresulted Labs (From admission, onward) Comment         None       Home Health: Yes Equipment/Devices: Rolling walker  Discharge Condition: Stable CODE STATUS: Full code Diet recommendation: Cardiac  Subjective: Seen and examined.  Feels much better.  Symptoms in the left arm and leg have improved 90% according to patient.  HPI: Clinton Padilla is a 59 y.o. male with medical history significant for tobacco abuse no chronic medical problems, presented to the emergency room for evaluation of left-sided tingling and numbness. He reports that he was feeling in his normal state of health until around 1:30 AM on 08/02/2019 he began to have tingling and weakness in his left leg.Marland Kitchen He did not make much of it and went to bed. He woke up at around 10 AM with worsening tingling numbness and weakness of the left leg.  So noted going in left arm and on left side of his face.  Reports he did not have any slurred speech or drooping face.  States he was able to eat and drink normally.  Reports that when he tries to walk, he is able to walk but it feels like his left leg is going to buckle.  He does state he has seemed off balance and has difficulty coordinating his left leg.He also noticed some difficulty with dexterity on the left hand.  States he has not had any falls at home.  Denies any headaches or visual changes. Denies prior strokes. Has a family history of one sibling with myasthenia gravis and another sibling with multiple sclerosis. Denies chest pain,  shortness of breath, nausea, vomiting, diarrhea, abdominal pain, urinary frequency, dysuria, tremors, fever, cough, rash.  ED Course: In the emergency room patient has a CT scan of his head which shows a small acute stroke at the lateral aspect of the thalamus.  ER provider consulted neurology, Dr. Laurence Slate, who is evaluated the patient and recommends patient be observed overnight and have further work-up.  Brief/Interim Summary: Patient was admitted under hospitalist service with new acute stroke on the right side of thalamus.  This was confirmed on the CT scan followed by MRI brain.  He was started on aspirin and Plavix.  He also had elevated LDL so he was started on atorvastatin.  When seen today, patient stated that his symptoms of left-sided weakness, numbness and tingling have improved 90%.  On examination, he had equal and normal strength in all 4 extremities.  Transthoracic echo did not show any abnormality.  He was evaluated by neurology and they cleared him for discharge with recommendations to continue DAPT for 2 weeks and then continue aspirin afterwards along with continue atorvastatin.  He was evaluated by PT and they recommended CIR however patient was very adamant on going home.  I had a long discussion with the patient and his daughter about PT recommendations.  Although wife wanted him to go to CIR however she also supported patient's decision of going home.  PTs major concern was navigating through the stairs and according to wife, patient works from home and is  master bedroom is on the main level as rest of the kitchen, laundry and living room and that patient has committed to not use the stairs to go to the deck unless somebody is there with him and he has strong family support.  Based on patient's preference, he is being discharged home.  Home health has been ordered.  Discharge Diagnoses:  Principal Problem:   Acute CVA (cerebrovascular accident) [facility])    Discharge  Instructions  Discharge Instructions    Ambulatory referral to Neurology   Complete by: As directed    Follow up with stroke clinic NP (Jessica Vanschaick or Darrol Angel, if both not available, consider Manson Allan, or Ahern) at [facility] in about 4 weeks. Thanks.     Allergies as of 08/03/2019   No Known Allergies     Medication List    STOP taking these medications   acetaminophen 325 MG tablet Commonly known as: TYLENOL   HYDROcodone-acetaminophen 5-325 MG tablet Commonly known as: NORCO/VICODIN     TAKE these medications   aspirin 81 MG EC tablet Take 1 tablet (81 mg total) by mouth daily. Swallow whole. Start taking on: August 04, 2019   atorvastatin 40 MG tablet Commonly known as: LIPITOR Take 1 tablet (40 mg total) by mouth daily. Start taking on: August 04, 2019   clopidogrel 75 MG tablet Commonly known as: PLAVIX Take 1 tablet (75 mg total) by mouth daily for 21 days. Start taking on: August 04, 2019   ibuprofen 200 MG tablet Commonly known as: ADVIL Take 400 mg by mouth every 6 (six) hours as needed for fever, headache, moderate pain or cramping.   naproxen 500 MG tablet Commonly known as: NAPROSYN Take 1 tablet (500 mg total) by mouth 2 (two) times daily as needed for moderate pain.       Follow-up Information    [facility]. Schedule an appointment as soon as possible for a visit in 4 week(s).   Specialty: Neurology Contact information: [address] [place] [address] [phone]       Tally Joe, MD Follow up in 1 week(s).   Specialty: Family Medicine Contact information: [phone] [address] [place] [address] [phone]              No Known Allergies  Consultations: Neurology   Procedures/Studies: CT HEAD WO CONTRAST  Result Date: 08/02/2019 CLINICAL DATA:  Left-sided numbness EXAM: CT HEAD WITHOUT CONTRAST TECHNIQUE: Contiguous axial images were obtained from the  base of the skull through the vertex without intravenous contrast. COMPARISON:  None. FINDINGS: Brain: There is no acute intracranial hemorrhage, mass effect, or edema. Gray-white differentiation is preserved. Possible small subcentimeter focus of hypoattenuation at the lateral aspect of the right thalamus. There is no extra-axial fluid collection. Ventricles and sulci are within normal limits in size and configuration. Vascular: No hyperdense vessel or unexpected calcification. Skull: Calvarium is unremarkable. Sinuses/Orbits: Patchy mucosal thickening.  Orbits are unremarkable. Other: None. IMPRESSION: Possible small acute infarct at the lateral margin of the right thalamus. Consider MRI for further evaluation. Electronically Signed   By: Guadlupe Spanish M.D.   On: 08/02/2019 12:58   MR ANGIO HEAD WO CONTRAST  Result Date: 08/03/2019 CLINICAL DATA:  Stroke on MRI EXAM: MRA HEAD WITHOUT CONTRAST TECHNIQUE: Angiographic images of the Circle of Willis were obtained using MRA technique without intravenous contrast. COMPARISON:  None. FINDINGS: Intracranial internal carotid arteries are patent. Middle and anterior cerebral arteries are patent.  Intracranial vertebral arteries, basilar artery, posterior cerebral arteries are patent. There is no significant stenosis or aneurysm. IMPRESSION: Normal MRA of the head. Electronically Signed   By: Guadlupe Spanish M.D.   On: 08/03/2019 12:25   MR Brain W and Wo Contrast  Result Date: 08/02/2019 CLINICAL DATA:  Focal neuro deficit rule out stroke. EXAM: MRI HEAD WITHOUT AND WITH CONTRAST TECHNIQUE: Multiplanar, multiecho pulse sequences of the brain and surrounding structures were obtained without and with intravenous contrast. CONTRAST:  6.63mL GADAVIST GADOBUTROL 1 MMOL/ML IV SOLN COMPARISON:  CT head 08/02/2019 FINDINGS: Brain: Small acute infarct right lateral thalamus and posterior limb internal capsule measuring up to 10 mm in diameter. No other acute infarct. No  chronic ischemic change. Normal ventricular size. Negative for hemorrhage or mass. Normal enhancement. Vascular: Normal arterial flow voids. Skull and upper cervical spine: No focal skeletal lesion. Sinuses/Orbits: Moderate mucosal edema paranasal sinuses. Negative orbit Other: None IMPRESSION: 1 cm acute infarct right lateral thalamus and posterior limb internal capsule. No significant chronic ischemic change. Electronically Signed   By: Marlan Palau M.D.   On: 08/02/2019 20:16   ECHOCARDIOGRAM COMPLETE  Result Date: 08/03/2019    ECHOCARDIOGRAM REPORT   Patient Name:   Clinton Padilla Date of Exam: 08/03/2019 Medical Rec #:  [id number]         Height:       68.0 in Accession #:    [id number]        Weight:       149.9 lb Date of Birth:  09-25-1960         BSA:          1.808 m Patient Age:    58 years          BP:           121/77 mmHg Patient Gender: M                 HR:           52 bpm. Exam Location:  Inpatient Procedure: 2D Echo, Cardiac Doppler and Color Doppler Indications:    Stroke  History:        Patient has no prior history of Echocardiogram examinations.                 Stroke; Risk Factors:Current Smoker.  Sonographer:    Sheralyn Boatman RDCS Referring Phys: [id number] [facility]  Sonographer Comments: Technically difficult study due to poor echo windows. IMPRESSIONS  1. Left ventricular ejection fraction, by estimation, is 55 to 60%. The left ventricle has normal function. The left ventricle has no regional wall motion abnormalities. Left ventricular diastolic parameters were normal.  2. Right ventricular systolic function is normal. The right ventricular size is normal. Tricuspid regurgitation signal is inadequate for assessing PA pressure.  3. The mitral valve is normal in structure. No evidence of mitral valve regurgitation. No evidence of mitral stenosis.  4. The aortic valve is grossly normal. Aortic valve regurgitation is not visualized. No aortic stenosis is present.  5. Aortic  dilatation noted. There is borderline dilatation of the ascending aorta measuring 37 mm.  6. The inferior vena cava is normal in size with greater than 50% respiratory variability, suggesting right atrial pressure of 3 mmHg. Comparison(s): No prior Echocardiogram. Conclusion(s)/Recommendation(s): Normal biventricular function without evidence of hemodynamically significant valvular heart disease. No intracardiac source of embolism detected on this transthoracic study. A transesophageal echocardiogram is recommended to exclude cardiac source of embolism if  clinically indicated. FINDINGS  Left Ventricle: Left ventricular ejection fraction, by estimation, is 55 to 60%. The left ventricle has normal function. The left ventricle has no regional wall motion abnormalities. The left ventricular internal cavity size was normal in size. There is  borderline left ventricular hypertrophy. Left ventricular diastolic parameters were normal. Right Ventricle: The right ventricular size is normal. No increase in right ventricular wall thickness. Right ventricular systolic function is normal. Tricuspid regurgitation signal is inadequate for assessing PA pressure. Left Atrium: Left atrial size was normal in size. Right Atrium: Right atrial size was normal in size. Pericardium: There is no evidence of pericardial effusion. Mitral Valve: The mitral valve is normal in structure. No evidence of mitral valve regurgitation. No evidence of mitral valve stenosis. Tricuspid Valve: The tricuspid valve is normal in structure. Tricuspid valve regurgitation is not demonstrated. No evidence of tricuspid stenosis. Aortic Valve: The aortic valve is grossly normal. Aortic valve regurgitation is not visualized. No aortic stenosis is present. Pulmonic Valve: The pulmonic valve was not well visualized. Pulmonic valve regurgitation is not visualized. No evidence of pulmonic stenosis. Aorta: Aortic dilatation noted. There is borderline dilatation of the  ascending aorta measuring 37 mm. Pulmonary Artery: The pulmonary artery is of normal size. Venous: The inferior vena cava is normal in size with greater than 50% respiratory variability, suggesting right atrial pressure of 3 mmHg. IAS/Shunts: The atrial septum is grossly normal.  LEFT VENTRICLE PLAX 2D LVIDd:         4.70 cm     Diastology LVIDs:         3.20 cm     LV e' lateral:   8.81 cm/s LV PW:         1.20 cm     LV E/e' lateral: 12.5 LV IVS:        1.00 cm     LV e' medial:    10.00 cm/s LVOT diam:     1.80 cm     LV E/e' medial:  11.0 LV SV:         40 LV SV Index:   22 LVOT Area:     2.54 cm  LV Volumes (MOD) LV vol d, MOD A2C: 65.3 ml LV vol d, MOD A4C: 54.5 ml LV vol s, MOD A2C: 25.6 ml LV vol s, MOD A4C: 25.8 ml LV SV MOD A2C:     39.8 ml LV SV MOD A4C:     54.5 ml LV SV MOD BP:      36.9 ml RIGHT VENTRICLE             IVC RV S prime:     12.40 cm/s  IVC diam: 2.00 cm TAPSE (M-mode): 3.0 cm LEFT ATRIUM             Index       RIGHT ATRIUM          Index LA diam:        2.80 cm 1.55 cm/m  RA Area:     9.37 cm LA Vol (A2C):   19.6 ml 10.84 ml/m RA Volume:   17.80 ml 9.84 ml/m LA Vol (A4C):   21.0 ml 11.61 ml/m LA Biplane Vol: 20.7 ml 11.45 ml/m  AORTIC VALVE LVOT Vmax:   69.60 cm/s LVOT Vmean:  46.600 cm/s LVOT VTI:    0.157 m  AORTA Ao Root diam: 3.60 cm Ao Asc diam:  3.45 cm MITRAL VALVE MV Area (PHT): 5.66 cm  SHUNTS MV Decel Time: 134 msec     Systemic VTI:  0.16 m MV E velocity: 110.00 cm/s  Systemic Diam: 1.80 cm MV A velocity: 59.60 cm/s MV E/A ratio:  1.85 Jodelle RedBridgette Damonte MD Electronically signed by Jodelle RedBridgette Onnie MD Signature Date/Time: 08/03/2019/11:07:20 AM    Final    [facility]ID ([facility] and [facility])  Result Date: 08/03/2019 Carotid Arterial Duplex Study Indications:       CVA. Risk Factors:      Hypertension, Diabetes, coronary artery disease. Comparison Study:  no prior Performing Technologist: Blanch MediaMegan Riddle RVS  Examination Guidelines: A complete evaluation  includes B-mode imaging, spectral Doppler, color Doppler, and power Doppler as needed of all accessible portions of each vessel. Bilateral testing is considered an integral part of a complete examination. Limited examinations for reoccurring indications may be performed as noted.  Right Carotid Findings: +----------+--------+--------+--------+------------------+--------+           PSV cm/sEDV cm/sStenosisPlaque DescriptionComments +----------+--------+--------+--------+------------------+--------+ CCA Prox  67      18              heterogenous               +----------+--------+--------+--------+------------------+--------+ CCA Distal62      19              heterogenous               +----------+--------+--------+--------+------------------+--------+ ICA Prox  44      17      1-39%   heterogenous               +----------+--------+--------+--------+------------------+--------+ ICA Distal62      24                                         +----------+--------+--------+--------+------------------+--------+ ECA       69      12                                         +----------+--------+--------+--------+------------------+--------+ +----------+--------+-------+--------+-------------------+           PSV cm/sEDV cmsDescribeArm Pressure (mmHG) +----------+--------+-------+--------+-------------------+ [id number]                                         +----------+--------+-------+--------+-------------------+ +---------+--------+--+--------+--+---------+ VertebralPSV cm/s43EDV cm/s17Antegrade +---------+--------+--+--------+--+---------+  Left Carotid Findings: +----------+--------+--------+--------+------------------+--------+           PSV cm/sEDV cm/sStenosisPlaque DescriptionComments +----------+--------+--------+--------+------------------+--------+ CCA Prox  83      16              heterogenous                +----------+--------+--------+--------+------------------+--------+ CCA Distal66      22              heterogenous               +----------+--------+--------+--------+------------------+--------+ ICA Prox  54      23      1-39%   heterogenous               +----------+--------+--------+--------+------------------+--------+ ICA Distal68      30                                         +----------+--------+--------+--------+------------------+--------+  ECA       61      14                                         +----------+--------+--------+--------+------------------+--------+ +----------+--------+--------+--------+-------------------+           PSV cm/sEDV cm/sDescribeArm Pressure (mmHG) +----------+--------+--------+--------+-------------------+ [id number]                                          +----------+--------+--------+--------+-------------------+ +---------+--------+--+--------+--+---------+ VertebralPSV cm/s43EDV cm/s18Antegrade +---------+--------+--+--------+--+---------+   Summary: Right Carotid: Velocities in the right ICA are consistent with a 1-39% stenosis. Left Carotid: Velocities in the left ICA are consistent with a 1-39% stenosis. Vertebrals: Bilateral vertebral arteries demonstrate antegrade flow. *See table(s) above for measurements and observations.     Preliminary       Discharge Exam: Vitals:   08/03/19 1026 08/03/19 1225  BP: 110/82 132/84  Pulse: (!) 54 (!) 54  Resp: 15 16  Temp:  98.9 F (37.2 C)  SpO2: 96% 97%   Vitals:   08/03/19 1024 08/03/19 1025 08/03/19 1026 08/03/19 1225  BP: 121/86 130/76 110/82 132/84  Pulse: 63 (!) 59 (!) 54 (!) 54  Resp: 16 15 15 16   Temp:    98.9 F (37.2 C)  TempSrc:    Oral  SpO2: 98% 96% 96% 97%    General: Pt is alert, awake, not in acute distress Cardiovascular: RRR, S1/S2 +, no rubs, no gallops Respiratory: CTA bilaterally, no wheezing, no rhonchi Abdominal: Soft, NT, ND,  bowel sounds + Extremities: no edema, no cyanosis    The results of significant diagnostics from this hospitalization (including imaging, microbiology, ancillary and laboratory) are listed below for reference.     Microbiology: Recent Results (from the past 240 hour(s))  SARS Coronavirus 2 by RT PCR (hospital order, performed in [facility] hospital lab) Nasopharyngeal Nasopharyngeal Swab     Status: None   Collection Time: 08/02/19  8:18 PM   Specimen: Nasopharyngeal Swab  Result Value Ref Range Status   SARS Coronavirus 2 NEGATIVE NEGATIVE Final    Comment: (NOTE) SARS-CoV-2 target nucleic acids are NOT DETECTED.  The SARS-CoV-2 RNA is generally detectable in upper and lower respiratory specimens during the acute phase of infection. The lowest concentration of SARS-CoV-2 viral copies this assay can detect is 250 copies / mL. A negative result does not preclude SARS-CoV-2 infection and should not be used as the sole basis for treatment or other patient management decisions.  A negative result may occur with improper specimen collection / handling, submission of specimen other than nasopharyngeal swab, presence of viral mutation(s) within the areas targeted by this assay, and inadequate number of viral copies (<250 copies / mL). A negative result must be combined with clinical observations, patient history, and epidemiological information.  Fact Sheet for Patients:   10/03/19  Fact Sheet for Healthcare Providers: BoilerBrush.com.cy  This test is not yet approved or  cleared by the https://pope.com/ FDA and has been authorized for detection and/or diagnosis of SARS-CoV-2 by FDA under an Emergency Use Authorization (EUA).  This EUA will remain in effect (meaning this test can be used) for the duration of the COVID-19 declaration under Section 564(b)(1) of the Act, 21 U.S.C. section 360bbb-3(b)(1), unless the authorization is  terminated or revoked sooner.  Performed at [facility], [address], [place], [place] [address]      Labs: BNP (last 3 results) No results for input(s): BNP in the last 8760 hours. Basic Metabolic Panel: Recent Labs  Lab 08/02/19 1234  NA 138  K 3.7  CL 107  CO2 22  GLUCOSE 121*  BUN 12  CREATININE 1.06  CALCIUM 8.9   Liver Function Tests: No results for input(s): AST, ALT, ALKPHOS, BILITOT, PROT, ALBUMIN in the last 168 hours. No results for input(s): LIPASE, AMYLASE in the last 168 hours. No results for input(s): AMMONIA in the last 168 hours. CBC: Recent Labs  Lab 08/02/19 1234  WBC 5.9  HGB 16.7  HCT 47.7  MCV 89.8  PLT 261   Cardiac Enzymes: No results for input(s): CKTOTAL, CKMB, CKMBINDEX, TROPONINI in the last 168 hours. BNP: Invalid input(s): POCBNP CBG: Recent Labs  Lab 08/02/19 1527  GLUCAP 86   D-Dimer No results for input(s): DDIMER in the last 72 hours. Hgb A1c Recent Labs    08/02/19 1234  HGBA1C 5.5   Lipid Profile Recent Labs    08/02/19 2253  CHOL 184  HDL 43  LDLCALC 127*  TRIG 69  CHOLHDL 4.3   Thyroid function studies No results for input(s): TSH, T4TOTAL, T3FREE, THYROIDAB in the last 72 hours.  Invalid input(s): FREET3 Anemia work up No results for input(s): VITAMINB12, FOLATE, FERRITIN, TIBC, IRON, RETICCTPCT in the last 72 hours. Urinalysis    Component Value Date/Time   COLORURINE YELLOW 08/02/2019 1800   APPEARANCEUR CLEAR 08/02/2019 1800   LABSPEC 1.017 08/02/2019 1800   PHURINE 5.0 08/02/2019 1800   GLUCOSEU NEGATIVE 08/02/2019 1800   HGBUR NEGATIVE 08/02/2019 1800   BILIRUBINUR NEGATIVE 08/02/2019 1800   KETONESUR NEGATIVE 08/02/2019 1800   PROTEINUR NEGATIVE 08/02/2019 1800   UROBILINOGEN 1.0 12/05/2014 0909   NITRITE NEGATIVE 08/02/2019 1800   LEUKOCYTESUR NEGATIVE 08/02/2019 1800   Sepsis Labs Invalid input(s): PROCALCITONIN,  WBC,  LACTICIDVEN Microbiology Recent Results (from the  past 240 hour(s))  SARS Coronavirus 2 by RT PCR (hospital order, performed in [facility] hospital lab) Nasopharyngeal Nasopharyngeal Swab     Status: None   Collection Time: 08/02/19  8:18 PM   Specimen: Nasopharyngeal Swab  Result Value Ref Range Status   SARS Coronavirus 2 NEGATIVE NEGATIVE Final    Comment: (NOTE) SARS-CoV-2 target nucleic acids are NOT DETECTED.  The SARS-CoV-2 RNA is generally detectable in upper and lower respiratory specimens during the acute phase of infection. The lowest concentration of SARS-CoV-2 viral copies this assay can detect is 250 copies / mL. A negative result does not preclude SARS-CoV-2 infection and should not be used as the sole basis for treatment or other patient management decisions.  A negative result may occur with improper specimen collection / handling, submission of specimen other than nasopharyngeal swab, presence of viral mutation(s) within the areas targeted by this assay, and inadequate number of viral copies (<250 copies / mL). A negative result must be combined with clinical observations, patient history, and epidemiological information.  Fact Sheet for Patients:   BoilerBrush.com.cy  Fact Sheet for Healthcare Providers: https://pope.com/  This test is not yet approved or  cleared by the [place] FDA and has been authorized for detection and/or diagnosis of SARS-CoV-2 by FDA under an Emergency Use Authorization (EUA).  This EUA will remain in effect (meaning this test can be used) for the duration of the COVID-19 declaration under Section 564(b)(1) of the Act,  21 U.S.C. section 360bbb-3(b)(1), unless the authorization is terminated or revoked sooner.  Performed at [facility], [address] [place], [place] [address]      Time coordinating discharge: Over 30 minutes  SIGNED:   Hughie Closs, MD  [facility] 08/03/2019, 1:08 PM  If 7PM-7AM, please  contact night-coverage [email or web address]

## 2019-08-03 NOTE — Discharge Instructions (Signed)
 Hospital Discharge After a Stroke  Being discharged from the hospital after a stroke can feel overwhelming. Many things may be different, and it is normal to feel scared or anxious. Some stroke survivors may be able to return to their homes, and others may need more specialized care on a temporary or permanent basis. Your stroke care team will work with you to develop a discharge plan that is best for you. Ask questions if you do not understand something. Invite a friend or family member to participate in discharge planning. Understanding and following your discharge plan can help to prevent another stroke or other problems. Understanding your medicines After a stroke, your health care provider may prescribe one or more types of medicine. It is important to take medicines exactly as told by your health care provider. Serious harm, such as another stroke, can happen if you are unable to take your medicine exactly as prescribed. Make sure you understand:  What medicine to take.  Why you are taking the medicine.  How and when to take it.  If it can be taken with your other medicines and herbal supplements.  Possible side effects.  When to call your health care provider if you have any side effects.  How you will get and pay for your medicines. Medical assistance programs may be able to help you pay for prescription medicines if you cannot afford them. If you are taking an anticoagulant, be sure to take it exactly as told by your health care provider. This type of medicine can increase the risk of bleeding because it works to prevent blood from clotting. You may need to take certain precautions to prevent bleeding. You should contact your health care provider if you have:  Bleeding or bruising.  A fall or other injury to your head.  Blood in your urine or stool (feces). Planning for home safety  Take steps to prevent falls, such as installing grab bars or using a shower chair. Ask a  friend or family member to get needed things in place before you go home if possible. A therapist can come to your home to make recommendations for safety equipment. Ask your health care provider if you would benefit from this service or from home care. Getting needed equipment Ask your health care provider for a list of any medical equipment and supplies you will need at home. These may include items such as:  Walkers.  Canes.  Wheelchairs.  Hand-strengthening devices.  Special eating utensils. Medical equipment can be rented or purchased, depending on your insurance coverage. Check with your insurance company about what is covered. Keeping follow-up visits After a stroke, you will need to follow up regularly with a health care provider. You may also need rehabilitation, which can include physical therapy, occupational therapy, or speech-language therapy. Keeping these appointments is very important to your recovery after a stroke. Be sure to bring your medicine list and discharge papers with you to your appointments. If you need help to keep track of your schedule, use a calendar or appointment reminder. Preventing another stroke Having a stroke puts you at risk for another stroke in the future. Ask your health care provider what actions you can take to lower the risk. These may include:  Increasing how much you exercise.  Making a healthy eating plan.  Quitting smoking.  Managing other health conditions, such as high blood pressure, high cholesterol, or diabetes.  Limiting alcohol use. Knowing the warning signs of a stroke  Make   sure you understand the signs of a stroke. Before you leave the hospital, you will receive information outlining the stroke warning signs. Share these with your friends and family members. "BE FAST" is an easy way to remember the main warning signs of a stroke:  B - Balance. Signs are dizziness, sudden trouble walking, or loss of balance.  E - Eyes.  Signs are trouble seeing or a sudden change in vision.  F - Face. Signs are sudden weakness or numbness of the face, or the face or eyelid drooping on one side.  A - Arms. Signs are weakness or numbness in an arm. This happens suddenly and usually on one side of the body.  S - Speech. Signs are sudden trouble speaking, slurred speech, or trouble understanding what people say.  T - Time. Time to call emergency services. Write down what time symptoms started. Other signs of stroke may include:  A sudden, severe headache with no known cause.  Nausea or vomiting.  Seizure. These symptoms may represent a serious problem that is an emergency. Do not wait to see if the symptoms will go away. Get medical help right away. Call your local emergency services (911 in the [place] Do not drive yourself to the hospital. Make note of the time that you had your first symptoms. Your emergency responders or emergency room staff will need to know this information. Summary  Being discharged from the hospital after a stroke can feel overwhelming. It is normal to feel scared or anxious.  Make sure you take medicines exactly as told by your health care provider.  Know the warning signs of a stroke, and get help right way if you have any of these symptoms. "BE FAST" is an easy way to remember the main warning signs of a stroke. This information is not intended to replace advice given to you by your health care provider. Make sure you discuss any questions you have with your health care provider. Document Revised: 09/30/2018 Document Reviewed: 04/12/2016 Elsevier Patient Education  2020 [employer]  

## 2019-08-03 NOTE — Progress Notes (Signed)
Rehab Admissions Coordinator Note:  Per PT recommendation, this patient was screened by Cheri Rous for appropriateness for an Inpatient Acute Rehab Consult.  Right now it appears that the patient prefers to DC home. He is already Supervision/Mod I for transfers and Supervision for most ADLs per chart review. Would agree with OT that home with [facility] is appropriate as pt does not require an IP Rehab stay. Will not pursue CIR for this patient.   Cheri Rous 08/03/2019, 3:50 PM  I can be reached at [phone].

## 2019-08-03 NOTE — TOC Initial Note (Signed)
Transition of Care [facility]) - Initial/Assessment Note    Patient Details  Name: Clinton Padilla MRN: [id number] Date of Birth: 1960-01-27  Transition of Care [facility]) CM/SW Contact:    Kingsley Plan, RN Phone Number: 08/03/2019, 2:31 PM  Clinical Narrative:                  Spoke to patient confirmed face sheet information. Patient from home with wife.   Ordered walker with Zack with [employer]    Patient has no preference in home health agency.   Referral given to and accepted by [facility] with [facility] Expected Discharge Plan: Home w Home Health Services Barriers to Discharge: No Barriers Identified   Patient Goals and CMS Choice Patient states their goals for this hospitalization and ongoing recovery are:: to return to home CMS Medicare.gov Compare Post Acute Care list provided to:: Patient Choice offered to / list presented to : Patient  Expected Discharge Plan and Services Expected Discharge Plan: Home w Home Health Services   Discharge Planning Services: CM Consult Post Acute Care Choice: Home Health Living arrangements for the past 2 months: Single Family Home Expected Discharge Date: 08/03/19               DME Arranged: Dan Humphreys rolling DME Agency: [employer] Date DME Agency Contacted: 08/03/19 Time DME Agency Contacted: [phone] Representative spoke with at DME Agency: Zack HH Arranged: PT, OT HH Agency: [facility] Date [facility] Agency Contacted: 08/03/19 Time HH Agency Contacted: [phone] Representative spoke with at [facility] Agency: Kandee Keen  Prior Living Arrangements/Services Living arrangements for the past 2 months: Single Family Home Lives with:: Spouse Patient language and need for interpreter reviewed:: Yes Do you feel safe going back to the place where you live?: Yes      Need for Family Participation in Patient Care: Yes (Comment) Care giver support system in place?: Yes (comment)   Criminal Activity/Legal Involvement Pertinent to Current  Situation/Hospitalization: No - Comment as needed  Activities of Daily Living Home Assistive Devices/Equipment: None ADL Screening (condition at time of admission) Patient's cognitive ability adequate to safely complete daily activities?: Yes Is the patient deaf or have difficulty hearing?: No Does the patient have difficulty seeing, even when wearing glasses/contacts?: No Does the patient have difficulty concentrating, remembering, or making decisions?: No Patient able to express need for assistance with ADLs?: Yes Does the patient have difficulty dressing or bathing?: No Independently performs ADLs?: Yes (appropriate for developmental age) Does the patient have difficulty walking or climbing stairs?: Yes Weakness of Legs: Left Weakness of Arms/Hands: None  Permission Sought/Granted   Permission granted to share information with : No              Emotional Assessment Appearance:: Appears stated age Attitude/Demeanor/Rapport: Engaged Affect (typically observed): Accepting Orientation: : Oriented to Self, Oriented to Place, Oriented to  Time, Oriented to Situation Alcohol / Substance Use: Not Applicable Psych Involvement: No (comment)  Admission diagnosis:  Acute CVA (cerebrovascular accident) (HCC) [I63.9] Thalamic stroke [facility]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 Acute CVA (cerebrovascular accident) (HCC) 08/02/2019  . Acute appendicitis 12/05/2014  . S/P laparoscopic appendectomy Nov 2016 12/05/2014   PCP:  Tally Joe, MD Pharmacy:   [facility] [phone] - [place], [place] - [address] [place] [address] Phone: [phone] Fax: [phone]     Social Determinants of Health (SDOH) Interventions  Readmission Risk Interventions No flowsheet data found.

## 2019-08-03 NOTE — Evaluation (Signed)
Occupational Therapy Evaluation Patient Details Name: Clinton Padilla MRN: [id number] DOB: 03-02-1960 Today's Date: 08/03/2019    History of Present Illness 59 yo male with onset of L side tingling and numbness was admitted, note his MRI found R lateral thalamic and posterior limb internal capsule infarcts.  MRA had no acute findings.  PMHx:  tobacco use, appendectomy,    Clinical Impression   This 59 y/o male presents with the above. PTA Pt reports being very independent with ADL, iADL and functional mobility, working from home. Pt very pleasant and willing to participate in therapy session today. He is currently at supervision - mod independent level for ADL and room level mobility tasks without AD. Pt noted with LUE deficits including impaired coordination/fine motor difficulty. Issued pt fine motor/coordination HEP and reviewed during session. Additionally reviewed and educated in safety/compensatory techniques for completing ADL tasks after return home to reduce risk for falls. Discussed signs/symptoms of stroke including BEFAST acronym with pt/pt's spouse verbalizing understanding. He will benefit from continued acute OT services and currently recommend post acute therapy services (pt/spouse reports plans for [facility] initially and may likely benefit from outpt neuro OT pending LUE improvement). Will continue to follow.     Follow Up Recommendations  Home health OT;Supervision - Intermittent (HH vs outpt neuro OT)    Equipment Recommendations  None recommended by OT           Precautions / Restrictions Precautions Precautions: Fall Precaution Comments: LLE incoordination Restrictions Weight Bearing Restrictions: No      Mobility Bed Mobility Overal bed mobility: Modified Independent                Transfers Overall transfer level: Needs assistance Equipment used: None Transfers: Sit to/from Stand Sit to Stand: Supervision;Modified independent (Device/Increase time)          General transfer comment: supervision to ensure safety/balance with initial stand, pt quick to move     Balance Overall balance assessment: Mild deficits observed, not formally tested Sitting-balance support: Feet supported Sitting balance-Leahy Scale: Good Sitting balance - Comments: good, able to cross legs to don his shoes Postural control: Right lateral lean Standing balance support: Bilateral upper extremity supported;During functional activity Standing balance-Leahy Scale: Fair Standing balance comment: fair with static and with dynamic is poor               High Level Balance Comments: catches his balance with steps on LLE at times, including buckling            ADL either performed or assessed with clinical judgement   ADL Overall ADL's : Needs assistance/impaired Eating/Feeding: Modified independent;Sitting   Grooming: Supervision/safety;Oral care;Standing Grooming Details (indicate cue type and reason): pt practicing using L hand to open toothpaste Upper Body Bathing: Supervision/ safety;Sitting   Lower Body Bathing: Supervison/ safety;Sit to/from stand;Sitting/lateral leans   Upper Body Dressing : Supervision/safety;Sitting   Lower Body Dressing: Supervision/safety;Sitting/lateral leans;Sit to/from stand Lower Body Dressing Details (indicate cue type and reason): pt donning shoes via figure 4 seated EOB Toilet Transfer: Supervision/safety;Ambulation Toilet Transfer Details (indicate cue type and reason): simulated via transfer to/from EOB  Toileting- Clothing Manipulation and Hygiene: Supervision/safety;Sitting/lateral lean;Sit to/from stand       Functional mobility during ADLs: Supervision/safety General ADL Comments: discussed return to driving and need to receive medical clearance from MD prior to return to driving, pt/pt's spouse verbalizing understanding      Vision Baseline Vision/History: No visual deficits Patient Visual Report: No change  from baseline  Vision Assessment?: No apparent visual deficits (not formally tested )     Perception     Praxis      Pertinent Vitals/Pain Pain Assessment: No/denies pain     Hand Dominance Right   Extremity/Trunk Assessment Upper Extremity Assessment Upper Extremity Assessment: LUE deficits/detail LUE Deficits / Details: grossly weaker LUE than RUE, impaired coordination, mild impairments in sensation noted  LUE Sensation: decreased light touch LUE Coordination: decreased fine motor   Lower Extremity Assessment Lower Extremity Assessment: Defer to PT evaluation LLE Deficits / Details: DF is 4- to 4, has moderate incoordination of LLE LLE Coordination: decreased gross motor   Cervical / Trunk Assessment Cervical / Trunk Assessment: Normal   Communication Communication Communication: No difficulties   Cognition Arousal/Alertness: Awake/alert Behavior During Therapy: WFL for tasks assessed/performed;Impulsive (occasional impulsivities) Overall Cognitive Status: Impaired/Different from baseline Area of Impairment: Safety/judgement                   Current Attention Level: Selective     Safety/Judgement: Decreased awareness of safety   Problem Solving: Slow processing;Requires tactile cues;Requires verbal cues General Comments: appears WFL for basic tasks, pt is occasionally quick to move - suspect partly due to pt is eager to return home, return to his baseline; pt's spouse reports no changes in cognition that she's noticed    General Comments  pt is moderately uncoordinated with LLE in testing foot taps, small marches    Exercises Exercises: Other exercises Other Exercises Other Exercises: issued fine motor/coordination handout for LUE, pt return demonstrating/verbalizing understanding    Shoulder Instructions      Home Living Family/patient expects to be discharged to:: Private residence Living Arrangements: Spouse/significant other;Children Available  Help at Discharge: Family Type of Home: House Home Access: Stairs to enter Secretary/administrator of Steps: 5 Entrance Stairs-Rails: Right Home Layout: Able to live on main level with bedroom/bathroom     Bathroom Shower/Tub: Producer, television/film/video: Standard     Home Equipment: Shower seat - built in          Prior Functioning/Environment Level of Independence: Independent        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        OT Problem List: Decreased strength;Decreased range of motion;Decreased activity tolerance;Impaired sensation;Decreased knowledge of use of DME or AE;Impaired UE functional use;Decreased coordination;Impaired balance (sitting and/or standing)      OT Treatment/Interventions: Self-care/ADL training;Therapeutic exercise;Neuromuscular education;Energy conservation;DME and/or AE instruction;Therapeutic activities;Cognitive remediation/compensation;Patient/family education;Balance training    OT Goals(Current goals can be found in the care plan section) Acute Rehab OT Goals Patient Stated Goal: to get directly home OT Goal Formulation: With patient Time For Goal Achievement: 08/17/19 Potential to Achieve Goals: Good  OT Frequency: Min 2X/week   Barriers to D/C:            Co-evaluation              AM-PAC OT "6 Clicks" Daily Activity     Outcome Measure Help from another person eating meals?: None Help from another person taking care of personal grooming?: A Little Help from another person toileting, which includes using toliet, bedpan, or urinal?: A Little Help from another person bathing (including washing, rinsing, drying)?: A Little Help from another person to put on and taking off regular upper body clothing?: None Help from another person to put on and taking off regular lower body clothing?: A Little 6 Click Score: 20   End of Session  Nurse Communication: Mobility status  Activity Tolerance: Patient tolerated  treatment well Patient left: in bed;with call bell/phone within reach;with family/visitor present  OT Visit Diagnosis: Other symptoms and signs involving the nervous system (R29.898);Other abnormalities of gait and mobility (R26.89)                Time: 5379-4327 OT Time Calculation (min): 27 min Charges:  OT General Charges $OT Visit: 1 Visit OT Evaluation $OT Eval Moderate Complexity: 1 Mod OT Treatments $Self Care/Home Management : 8-22 mins  Marcy Siren, OT Acute Rehabilitation Services Pager [phone] Office [phone]   Clinton Padilla 08/03/2019, 2:28 PM

## 2019-08-03 NOTE — TOC CAGE-AID Note (Signed)
Transition of Care [facility]) - CAGE-AID Screening   Patient Details  Name: Clinton Padilla MRN: [id number] Date of Birth: [birth year]  [facility]) CM/SW Contact:    Jimmy Picket, [place] Phone Number: 08/03/2019, 4:15 PM   Clinical Narrative:  Pt reports occasional alcohol use. Pt reports he never has more than two drinks at a time. Pt denies substance use. Pt was receptive to Agricultural engineer.   CAGE-AID Screening:    Have You Ever Felt You Ought to Cut Down on Your Drinking or Drug Use?: No Have People Annoyed You By Critizing Your Drinking Or Drug Use?: No Have You Felt Bad Or Guilty About Your Drinking Or Drug Use?: No Have You Ever Had a Drink or Used Drugs First Thing In The Morning to Steady Your Nerves or to Get Rid of a Hangover?: No CAGE-AID Score: 0  Substance Abuse Education Offered: Yes  Substance abuse interventions: Educational Materials   Jimmy Picket, Theresia Majors, [facility] Clinical Social Worker [phone]

## 2019-08-03 NOTE — Progress Notes (Signed)
  Echocardiogram 2D Echocardiogram has been performed.  Clinton Padilla 08/03/2019, 10:03 AM

## 2019-08-03 NOTE — Progress Notes (Signed)
Will retry when pt returns from MRI.   08/03/19 1100  PT Visit Information  Last PT Received On 08/03/19  Reason Eval/Treat Not Completed Patient at procedure or test/unavailable    Samul Dada, PT MS Acute Rehab Dept. Number: [facility] [id number] and [facility] [phone]

## 2019-08-05 DIAGNOSIS — I071 Rheumatic tricuspid insufficiency: Secondary | ICD-10-CM | POA: Diagnosis not present

## 2019-08-05 DIAGNOSIS — I517 Cardiomegaly: Secondary | ICD-10-CM | POA: Diagnosis not present

## 2019-08-05 DIAGNOSIS — F17210 Nicotine dependence, cigarettes, uncomplicated: Secondary | ICD-10-CM | POA: Diagnosis not present

## 2019-08-05 DIAGNOSIS — Z7982 Long term (current) use of aspirin: Secondary | ICD-10-CM | POA: Diagnosis not present

## 2019-08-05 DIAGNOSIS — Z7902 Long term (current) use of antithrombotics/antiplatelets: Secondary | ICD-10-CM | POA: Diagnosis not present

## 2019-08-05 DIAGNOSIS — I69354 Hemiplegia and hemiparesis following cerebral infarction affecting left non-dominant side: Secondary | ICD-10-CM | POA: Diagnosis not present

## 2019-08-06 DIAGNOSIS — Z7982 Long term (current) use of aspirin: Secondary | ICD-10-CM | POA: Diagnosis not present

## 2019-08-06 DIAGNOSIS — I517 Cardiomegaly: Secondary | ICD-10-CM | POA: Diagnosis not present

## 2019-08-06 DIAGNOSIS — I071 Rheumatic tricuspid insufficiency: Secondary | ICD-10-CM | POA: Diagnosis not present

## 2019-08-06 DIAGNOSIS — F17210 Nicotine dependence, cigarettes, uncomplicated: Secondary | ICD-10-CM | POA: Diagnosis not present

## 2019-08-06 DIAGNOSIS — I69354 Hemiplegia and hemiparesis following cerebral infarction affecting left non-dominant side: Secondary | ICD-10-CM | POA: Diagnosis not present

## 2019-08-10 ENCOUNTER — Other Ambulatory Visit (HOSPITAL_COMMUNITY): Payer: Self-pay | Admitting: Family Medicine

## 2019-08-10 DIAGNOSIS — F17200 Nicotine dependence, unspecified, uncomplicated: Secondary | ICD-10-CM | POA: Diagnosis not present

## 2019-08-10 DIAGNOSIS — I639 Cerebral infarction, unspecified: Secondary | ICD-10-CM | POA: Diagnosis not present

## 2019-08-10 DIAGNOSIS — E7800 Pure hypercholesterolemia, unspecified: Secondary | ICD-10-CM | POA: Diagnosis not present

## 2019-08-11 DIAGNOSIS — Z7982 Long term (current) use of aspirin: Secondary | ICD-10-CM | POA: Diagnosis not present

## 2019-08-11 DIAGNOSIS — I69354 Hemiplegia and hemiparesis following cerebral infarction affecting left non-dominant side: Secondary | ICD-10-CM | POA: Diagnosis not present

## 2019-08-11 DIAGNOSIS — F17210 Nicotine dependence, cigarettes, uncomplicated: Secondary | ICD-10-CM | POA: Diagnosis not present

## 2019-08-11 DIAGNOSIS — I071 Rheumatic tricuspid insufficiency: Secondary | ICD-10-CM | POA: Diagnosis not present

## 2019-08-11 DIAGNOSIS — I517 Cardiomegaly: Secondary | ICD-10-CM | POA: Diagnosis not present

## 2019-08-12 ENCOUNTER — Other Ambulatory Visit: Payer: Self-pay

## 2019-08-12 ENCOUNTER — Ambulatory Visit (HOSPITAL_COMMUNITY)
Admission: RE | Admit: 2019-08-12 | Discharge: 2019-08-12 | Disposition: A | Discharge status: Home / Self Care | Payer: 59 | Source: Ambulatory Visit | Attending: Neurology | Admitting: Neurology

## 2019-08-12 DIAGNOSIS — I63311 Cerebral infarction due to thrombosis of right middle cerebral artery: Secondary | ICD-10-CM | POA: Diagnosis not present

## 2019-08-12 MED ORDER — SODIUM CHLORIDE 0.9% FLUSH
10.0000 mL | INTRAVENOUS | Status: AC | PRN
  Administered 2019-08-12 (×2): 10 mL via INTRAVENOUS

## 2019-08-12 NOTE — Progress Notes (Addendum)
TCD bubble study has been completed.   Preliminary results in CV Proc.   Blanch Media 08/12/2019 2:20 PM

## 2019-08-13 DIAGNOSIS — I69354 Hemiplegia and hemiparesis following cerebral infarction affecting left non-dominant side: Secondary | ICD-10-CM | POA: Diagnosis not present

## 2019-08-13 DIAGNOSIS — Z7982 Long term (current) use of aspirin: Secondary | ICD-10-CM | POA: Diagnosis not present

## 2019-08-13 DIAGNOSIS — F17210 Nicotine dependence, cigarettes, uncomplicated: Secondary | ICD-10-CM | POA: Diagnosis not present

## 2019-08-13 DIAGNOSIS — I517 Cardiomegaly: Secondary | ICD-10-CM | POA: Diagnosis not present

## 2019-08-13 DIAGNOSIS — I071 Rheumatic tricuspid insufficiency: Secondary | ICD-10-CM | POA: Diagnosis not present

## 2019-08-30 MED FILL — ATORVASTATIN 40 MG TABLET: 40 | 90 days supply | Qty: 90 | Fill #0

## 2019-09-01 ENCOUNTER — Encounter: Payer: Self-pay | Admitting: Adult Health

## 2019-09-01 ENCOUNTER — Ambulatory Visit (INDEPENDENT_AMBULATORY_CARE_PROVIDER_SITE_OTHER): Payer: 59 | Admitting: Adult Health

## 2019-09-01 VITALS — BP 128/86 | HR 89 | Ht 68.0 in | Wt 152.0 lb

## 2019-09-01 DIAGNOSIS — R200 Anesthesia of skin: Secondary | ICD-10-CM

## 2019-09-01 DIAGNOSIS — I639 Cerebral infarction, unspecified: Secondary | ICD-10-CM | POA: Diagnosis not present

## 2019-09-01 DIAGNOSIS — Z720 Tobacco use: Secondary | ICD-10-CM

## 2019-09-01 DIAGNOSIS — R202 Paresthesia of skin: Secondary | ICD-10-CM

## 2019-09-01 DIAGNOSIS — I6381 Other cerebral infarction due to occlusion or stenosis of small artery: Secondary | ICD-10-CM

## 2019-09-01 DIAGNOSIS — E785 Hyperlipidemia, unspecified: Secondary | ICD-10-CM

## 2019-09-01 NOTE — Patient Instructions (Addendum)
You will obtain a MRI brain to rule out new or worsening stroke with worsening numbness symptoms - you will be called to schedule imaging   Continue aspirin 81 mg daily  and atorvastatin  for secondary stroke prevention  Continue to follow up with PCP regarding cholesterol management  Highly encourage smoking cessation - f/u with your PCP  Maintain strict control of hypertension with blood pressure goal below 130/90, diabetes with hemoglobin A1c goal below 6.5% and cholesterol with LDL cholesterol (bad cholesterol) goal below 70 mg/dL.       Followup in the future with me in 3 months or call earlier if needed       Thank you for coming to see [place] at [facility]. I hope we have been able to provide you high quality care today.  You may receive a patient satisfaction survey over the next few weeks. We would appreciate your feedback and comments so that we may continue to improve ourselves and the health of our patients.     Stroke Prevention Some medical conditions and behaviors are associated with a higher chance of having a stroke. You can help prevent a stroke by making nutrition, lifestyle, and other changes, including managing any medical conditions you may have. What nutrition changes can be made?   Eat healthy foods. You can do this by: ? Choosing foods high in fiber, such as fresh fruits and vegetables and whole grains. ? Eating at least 5 or more servings of fruits and vegetables a day. Try to fill half of your plate at each meal with fruits and vegetables. ? Choosing lean protein foods, such as lean cuts of meat, poultry without skin, fish, tofu, beans, and nuts. ? Eating low-fat dairy products. ? Avoiding foods that are high in salt (sodium). This can help lower blood pressure. ? Avoiding foods that have saturated fat, trans fat, and cholesterol. This can help prevent high cholesterol. ? Avoiding processed and premade foods.  Follow your health care  provider's specific guidelines for losing weight, controlling high blood pressure (hypertension), lowering high cholesterol, and managing diabetes. These may include: ? Reducing your daily calorie intake. ? Limiting your daily sodium intake to 1,500 milligrams (mg). ? Using only healthy fats for cooking, such as olive oil, canola oil, or sunflower oil. ? Counting your daily carbohydrate intake. What lifestyle changes can be made?  Maintain a healthy weight. Talk to your health care provider about your ideal weight.  Get at least 30 minutes of moderate physical activity at least 5 days a week. Moderate activity includes brisk walking, biking, and swimming.  Do not use any products that contain nicotine or tobacco, such as cigarettes and e-cigarettes. If you need help quitting, ask your health care provider. It may also be helpful to avoid exposure to secondhand smoke.  Limit alcohol intake to no more than 1 drink a day for nonpregnant women and 2 drinks a day for men. One drink equals 12 oz of beer, 5 oz of wine, or 1 oz of hard liquor.  Stop any illegal drug use.  Avoid taking birth control pills. Talk to your health care provider about the risks of taking birth control pills if: ? You are over 59 years old. ? You smoke. ? You get migraines. ? You have ever had a blood clot. What other changes can be made?  Manage your cholesterol levels. ? Eating a healthy diet is important for preventing high cholesterol. If cholesterol cannot be managed through diet  alone, you may also need to take medicines. ? Take any prescribed medicines to control your cholesterol as told by your health care provider.  Manage your diabetes. ? Eating a healthy diet and exercising regularly are important parts of managing your blood sugar. If your blood sugar cannot be managed through diet and exercise, you may need to take medicines. ? Take any prescribed medicines to control your diabetes as told by your health  care provider.  Control your hypertension. ? To reduce your risk of stroke, try to keep your blood pressure below 130/80. ? Eating a healthy diet and exercising regularly are an important part of controlling your blood pressure. If your blood pressure cannot be managed through diet and exercise, you may need to take medicines. ? Take any prescribed medicines to control hypertension as told by your health care provider. ? Ask your health care provider if you should monitor your blood pressure at home. ? Have your blood pressure checked every year, even if your blood pressure is normal. Blood pressure increases with age and some medical conditions.  Get evaluated for sleep disorders (sleep apnea). Talk to your health care provider about getting a sleep evaluation if you snore a lot or have excessive sleepiness.  Take over-the-counter and prescription medicines only as told by your health care provider. Aspirin or blood thinners (antiplatelets or anticoagulants) may be recommended to reduce your risk of forming blood clots that can lead to stroke.  Make sure that any other medical conditions you have, such as atrial fibrillation or atherosclerosis, are managed. What are the warning signs of a stroke? The warning signs of a stroke can be easily remembered as BEFAST.  B is for balance. Signs include: ? Dizziness. ? Loss of balance or coordination. ? Sudden trouble walking.  E is for eyes. Signs include: ? A sudden change in vision. ? Trouble seeing.  F is for face. Signs include: ? Sudden weakness or numbness of the face. ? The face or eyelid drooping to one side.  A is for arms. Signs include: ? Sudden weakness or numbness of the arm, usually on one side of the body.  S is for speech. Signs include: ? Trouble speaking (aphasia). ? Trouble understanding.  T is for time. ? These symptoms may represent a serious problem that is an emergency. Do not wait to see if the symptoms will go  away. Get medical help right away. Call your local emergency services (911 in the [place] Do not drive yourself to the hospital.  Other signs of stroke may include: ? A sudden, severe headache with no known cause. ? Nausea or vomiting. ? Seizure. Where to find more information For more information, visit:  American Stroke Association: www.strokeassociation.org  National Stroke Association: www.stroke.org Summary  You can prevent a stroke by eating healthy, exercising, not smoking, limiting alcohol intake, and managing any medical conditions you may have.  Do not use any products that contain nicotine or tobacco, such as cigarettes and e-cigarettes. If you need help quitting, ask your health care provider. It may also be helpful to avoid exposure to secondhand smoke.  Remember BEFAST for warning signs of stroke. Get help right away if you or a loved one has any of these signs. This information is not intended to replace advice given to you by your health care provider. Make sure you discuss any questions you have with your health care provider. Document Revised: 12/20/2016 Document Reviewed: 02/13/2016 Elsevier Patient Education  2020 [employer].

## 2019-09-01 NOTE — Progress Notes (Signed)
[facility] [address] [place] [place] [address] [phone]       HOSPITAL FOLLOW UP NOTE  [name] Date of Birth:  07-16-1960 Medical Record Number:  [id number]   Reason for Referral:  hospital stroke follow up    SUBJECTIVE:   CHIEF COMPLAINT:  Chief Complaint  Patient presents with  . Follow-up    tx rm here for a f/u from a stroke. Pt is having worsening sx. Pt is here with his wife Clinton Padilla    HPI:   [name] is a 59 y.o. male with history of tobacco use  who presented on 08/02/2019 with L arm, leg and face tingling and numbness.  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  Recommended DAPT for 3 weeks and aspirin alone.  LDL 127 initiate atorvastatin 40 mg daily.  Current tobacco use with smoking cessation counseling provided.  No prior history of stroke.  No history of HTN or DM.  Evaluated by therapy who recommended CIR but patient declined and discharged home with recommended HH vs OP therapy  Stroke:   R lateral thalamic / PLIC infarct secondary to small vessel disease source  CT head possible lateral R thalamic infarct   MRI  R lateral thalamic and PLIC infarct   MRA normal  Carotid Doppler  unremarkable  2D Echo EF 55-60%  Recommend outpt TCD bubble study to rule out afib given bouts of cough before symptoms.  LDL 127  HgbA1c 5.5  VTE prophylaxis - SCDs  No antithrombotic prior to admission, now on aspirin 81 mg daily and clopidogrel 75 mg daily DAPT for 3 weeks and then ASA alone.  Therapy recommendations:  CIR -- > patient declined --> HH vs OP therapy  Disposition:   Home  Today, 09/01/2019, [name] is being seen for hospital follow-up accompanied by his wife  He has recovered well from a stroke standpoint with resolution of prior left-sided weakness/incoordination as well as gait impairment but does continue to experience left-sided numbness.  He  reports initially improving by Monday night experience worsening of complete left-sided numbness with continued increased numbness today.  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  Continues on atorvastatin without myalgias.  Blood pressure today 28/86.  Tobacco use ongoing smoking less than 1 pack/day.  He plans on speaking further with PCP regarding Chantix use as he is willing to quit.  TCD bubble completed 08/12/2019 negative  No further concerns at this time     ROS:   14 system review of systems performed and negative with exception of numbness/tingling  PMH: No past medical history on file.  PSH:  Past Surgical History:  Procedure Laterality Date  . ELBOW SURGERY    . LAPAROSCOPIC APPENDECTOMY N/A 12/05/2014   Procedure: APPENDECTOMY LAPAROSCOPIC;  Surgeon: Luretha Murphy, MD;  Location: [facility];  Service: General;  Laterality: N/A;    Social History:  Social History   Socioeconomic History  . Marital status: Married    Spouse name: Not on file  . Number of children: Not on file  . Years of education: Not on file  . Highest education level: Not on file  Occupational History  . Not on file  Tobacco Use  . Smoking status: Current Every Day  Smoker    Types: Cigarettes  . Smokeless tobacco: Never Used  Substance and Sexual Activity  . Alcohol use: No    Comment: social   . Drug use: Never  . Sexual activity: Not on file  Other Topics Concern  . Not on file  Social History Narrative  . Not on file   Social Determinants of Health   Financial Resource Strain:   . Difficulty of Paying Living Expenses:   Food Insecurity:   . Worried About Programme researcher, broadcasting/film/video in the Last Year:   . Barista in the Last Year:   Transportation Needs:   . Freight forwarder  (Medical):   Marland Kitchen Lack of Transportation (Non-Medical):   Physical Activity:   . Days of Exercise per Week:   . Minutes of Exercise per Session:   Stress:   . Feeling of Stress :   Social Connections:   . Frequency of Communication with Friends and Family:   . Frequency of Social Gatherings with Friends and Family:   . Attends Religious Services:   . Active Member of Clubs or Organizations:   . Attends Banker Meetings:   Marland Kitchen Marital Status:   Intimate Partner Violence:   . Fear of Current or Ex-Partner:   . Emotionally Abused:   Marland Kitchen Physically Abused:   . Sexually Abused:     Family History: No family history on file.  Medications:   Current Outpatient Medications on File Prior to Visit  Medication Sig Dispense Refill  . aspirin EC 81 MG EC tablet Take 1 tablet (81 mg total) by mouth daily. Swallow whole. 30 tablet 0  . atorvastatin (LIPITOR) 40 MG tablet Take 1 tablet (40 mg total) by mouth daily. 30 tablet 0  . ibuprofen (ADVIL,MOTRIN) 200 MG tablet Take 400 mg by mouth every 6 (six) hours as needed for fever, headache, moderate pain or cramping.    . naproxen (NAPROSYN) 500 MG tablet Take 1 tablet (500 mg total) by mouth 2 (two) times daily as needed for moderate pain. 30 tablet 0   No current facility-administered medications on file prior to visit.    Allergies:  No Known Allergies    OBJECTIVE:  Physical Exam  Vitals:   09/01/19 0900  BP: 128/86  Pulse: 89  Weight: 152 lb (68.9 kg)  Height: 5\' 8"  (1.727 m)   Body mass index is 23.11 kg/m. No exam data present  General: well developed, well nourished,  pleasant middle-age Caucasian male, seated, in no evident distress Head: head normocephalic and atraumatic.   Neck: supple with no carotid or supraclavicular bruits Cardiovascular: regular rate and rhythm, no murmurs Musculoskeletal: no deformity Skin:  no rash/petichiae Vascular:  Normal pulses all extremities   Neurologic Exam Mental Status:  Awake and fully alert.   Fluent speech and language.  Oriented to place and time. Recent and remote memory intact. Attention span, concentration and fund of knowledge appropriate. Mood and affect appropriate.  Cranial Nerves: Fundoscopic exam reveals sharp disc margins. Pupils equal, briskly reactive to light. Extraocular movements full without nystagmus. Visual fields full to confrontation. Hearing intact. Facial sensation intact. Face, tongue, palate moves normally and symmetrically.  Motor: Normal bulk and tone. Normal strength in all tested extremity muscles. Sensory.: intact to touch , pinprick , position and vibratory sensation.  Subjective left face, arm and leg numbness Coordination: Rapid alternating movements normal in all extremities. Finger-to-nose and heel-to-shin performed accurately bilaterally. Gait and Station: Arises from chair  without difficulty. Stance is normal. Gait demonstrates normal stride length and balance Reflexes: 1+ and symmetric. Toes downgoing.     NIHSS  0 Modified Rankin  1     ASSESSMENT: Clinton Padilla is a 59 y.o. 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 Vascular risk factors include tobacco use and HLD.      PLAN:  1. R lateral thalamic/PLIC stroke:  a. Residual deficit: Left-sided numbness/tingling initially improving but experienced worsening 2 days ago.  Discussion regarding fluctuation of symptoms vs recrudescence of prior stroke symptoms vs expansion of prior stroke vs new stroke.  Obtain MRI brain wo contrast for further evaluation.  Discussed ongoing HEP for likely ongoing improvement b. Discussed importance of calling 911 immediately with any new onset or reoccurring stroke/TIA symptoms c. Continue aspirin 81 mg daily  and atorvastatin 40 mg daily for secondary stroke prevention.  d. Close  PCP follow up for aggressive stroke risk factor management  2. HLD:  a. LDL goal <70.  b. Recent LDL 127.   c. Continue atorvastatin 40 mg daily.   d. F/u with PCP for management as well as prescribing of statin 3. Tobacco use: a. Discussed importance of complete cessation b. Patient willing to quit planning on use of Chantix as he had prior success c. Plans on following up with PCP     Follow up in 3 months or call earlier if needed   I spent 45 minutes of face-to-face and non-face-to-face time with patient and wife.  This included previsit chart review, lab review, study review, order entry, electronic health record documentation, patient education regarding recent stroke, residual deficits with recent worsening of symptoms, importance of managing stroke risk factors and answered all questions to patient satisfaction     Ihor Austin, [facility]  [facility] [address] [place], [place] [address]  Phone [phone] Fax [phone] Note: This document was prepared with digital dictation and possible smart phrase technology. Any transcriptional errors that result from this process are unintentional.

## 2019-09-02 ENCOUNTER — Telehealth: Payer: Self-pay | Admitting: Adult Health

## 2019-09-02 NOTE — Telephone Encounter (Signed)
no to the covid questions MR Brain wo contrast Ihor Austin [facility] Auth: NPR Ref # [id number]. Patient is scheduled at [facility] for 09/07/19.

## 2019-09-02 NOTE — Progress Notes (Signed)
I agree with the above plan 

## 2019-09-07 ENCOUNTER — Other Ambulatory Visit: Payer: 59

## 2019-09-07 DIAGNOSIS — Z20822 Contact with and (suspected) exposure to covid-19: Secondary | ICD-10-CM | POA: Diagnosis not present

## 2019-09-15 ENCOUNTER — Ambulatory Visit (INDEPENDENT_AMBULATORY_CARE_PROVIDER_SITE_OTHER): Payer: 59

## 2019-09-15 ENCOUNTER — Other Ambulatory Visit: Payer: Self-pay

## 2019-09-15 DIAGNOSIS — R200 Anesthesia of skin: Secondary | ICD-10-CM

## 2019-09-15 DIAGNOSIS — R202 Paresthesia of skin: Secondary | ICD-10-CM | POA: Diagnosis not present

## 2019-09-21 ENCOUNTER — Telehealth: Payer: Self-pay | Admitting: Adult Health

## 2019-09-21 NOTE — Telephone Encounter (Signed)
Clinton Austin, NP  09/16/2019 5:49 PM EDT     Please advise patient that recent imaging showed no acute abnormality and likely due to prior stroke   Left message for patient to call back.

## 2019-09-21 NOTE — Telephone Encounter (Signed)
Pt called back and the message from Jessica,NP was relayed to pt.

## 2019-11-05 DIAGNOSIS — Z1211 Encounter for screening for malignant neoplasm of colon: Secondary | ICD-10-CM | POA: Diagnosis not present

## 2019-11-05 DIAGNOSIS — Z8673 Personal history of transient ischemic attack (TIA), and cerebral infarction without residual deficits: Secondary | ICD-10-CM | POA: Diagnosis not present

## 2019-11-05 DIAGNOSIS — Z0000 Encounter for general adult medical examination without abnormal findings: Secondary | ICD-10-CM | POA: Diagnosis not present

## 2019-11-05 DIAGNOSIS — I700 Atherosclerosis of aorta: Secondary | ICD-10-CM | POA: Diagnosis not present

## 2019-11-05 DIAGNOSIS — R7309 Other abnormal glucose: Secondary | ICD-10-CM | POA: Diagnosis not present

## 2019-11-05 DIAGNOSIS — E7800 Pure hypercholesterolemia, unspecified: Secondary | ICD-10-CM | POA: Diagnosis not present

## 2019-11-05 DIAGNOSIS — Z125 Encounter for screening for malignant neoplasm of prostate: Secondary | ICD-10-CM | POA: Diagnosis not present

## 2019-11-22 MED FILL — ATORVASTATIN 40 MG TABLET: 40 | 90 days supply | Qty: 90 | Fill #1

## 2019-12-02 ENCOUNTER — Ambulatory Visit (INDEPENDENT_AMBULATORY_CARE_PROVIDER_SITE_OTHER): Payer: 59 | Admitting: Adult Health

## 2019-12-02 ENCOUNTER — Encounter: Payer: Self-pay | Admitting: Adult Health

## 2019-12-02 VITALS — BP 128/87 | HR 85 | Ht 68.0 in | Wt 159.0 lb

## 2019-12-02 DIAGNOSIS — I6381 Other cerebral infarction due to occlusion or stenosis of small artery: Secondary | ICD-10-CM

## 2019-12-02 DIAGNOSIS — I639 Cerebral infarction, unspecified: Secondary | ICD-10-CM | POA: Diagnosis not present

## 2019-12-02 NOTE — Progress Notes (Signed)
[facility] [address] [place]. [place] [address] [phone]       STROKE FOLLOW UP NOTE  [name] Date of Birth:  [birth year] Medical Record Number:  [id number]   Reason for Referral: stroke follow up    SUBJECTIVE:   CHIEF COMPLAINT:  Chief Complaint  Patient presents with  . Follow-up    rm 9  . Cerebrovascular Accident    pt has no new concerns. Here for a routine f/u    HPI:   Today, 12/02/2019, [name] returns for stroke follow-up. Reports residual deficits of slight numbness/tingling left hand and bottom of foot but overall greatly improving.  Denies new or worsening stroke/TIA symptoms.  He continues to do all prior activities including working without difficulty.  Remains on aspirin 81mg  daily and atorvastatin 40mg  daily without side effects.  Blood pressure today 128/87.  No further concerns at this time.    History provided for reference purposes only Initial visit 09/01/2019 JM: [name] is being seen for hospital follow-up accompanied by his wife He has recovered well from a stroke standpoint with resolution of prior left-sided weakness/incoordination as well as gait impairment but does continue to experience left-sided numbness.  He reports initially improving by Monday night experience worsening of complete left-sided numbness with continued increased numbness today.  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  Continues on atorvastatin without myalgias.  Blood pressure today 28/86.  Tobacco use ongoing smoking less than 1 pack/day.  He plans on speaking further with PCP regarding Chantix use as he is willing to quit. TCD bubble completed 08/12/2019 negative No further concerns at this time  Stroke admission 08/02/2019 Mr.  Clinton Padilla is a 59 y.o. male with history of tobacco use  who presented on 08/02/2019 with L arm, leg and face tingling and numbness.  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  Recommended DAPT for 3 weeks and aspirin alone.  LDL 127 initiate atorvastatin 40 mg daily.  Current tobacco use with smoking cessation counseling provided.  No prior history of stroke.  No history of HTN or DM.  Evaluated by therapy who recommended CIR but patient declined and discharged home with recommended HH vs OP therapy  Stroke:   R lateral thalamic / PLIC infarct secondary to small vessel disease source  CT head possible lateral R thalamic infarct   MRI  R lateral thalamic and PLIC infarct   MRA normal  Carotid Doppler  unremarkable  2D Echo EF 55-60%  Recommend outpt TCD bubble study to rule out afib given bouts of cough before symptoms.  LDL 127  HgbA1c 5.5  VTE prophylaxis - SCDs  No antithrombotic prior to admission, now on aspirin 81 mg daily and clopidogrel 75 mg daily DAPT for 3 weeks and then ASA alone.  Therapy recommendations:  CIR -- > patient declined --> HH vs OP therapy  Disposition:   Home     ROS:   14 system review of systems performed and negative with exception of numbness/tingling  PMH: No past medical history on file.  PSH:  Past Surgical History:  Procedure Laterality Date  . ELBOW SURGERY    . LAPAROSCOPIC APPENDECTOMY N/A 12/05/2014   Procedure: APPENDECTOMY LAPAROSCOPIC;  Surgeon: 10/03/2019, MD;  Location: 12/07/2014  ORS;  Service: General;  Laterality: N/A;    Social History:  Social History   Socioeconomic History  . Marital status: Married    Spouse name: Not on file  . Number of children: Not on file  . Years of education: Not on file  . Highest education level: Not on file  Occupational History  . Not on file  Tobacco Use  . Smoking status: Current Every Day Smoker    Types: Cigarettes  .  Smokeless tobacco: Never Used  Substance and Sexual Activity  . Alcohol use: No    Comment: social   . Drug use: Never  . Sexual activity: Not on file  Other Topics Concern  . Not on file  Social History Narrative  . Not on file   Social Determinants of Health   Financial Resource Strain:   . Difficulty of Paying Living Expenses: Not on file  Food Insecurity:   . Worried About Programme researcher, broadcasting/film/video in the Last Year: Not on file  . Ran Out of Food in the Last Year: Not on file  Transportation Needs:   . Lack of Transportation (Medical): Not on file  . Lack of Transportation (Non-Medical): Not on file  Physical Activity:   . Days of Exercise per Week: Not on file  . Minutes of Exercise per Session: Not on file  Stress:   . Feeling of Stress : Not on file  Social Connections:   . Frequency of Communication with Friends and Family: Not on file  . Frequency of Social Gatherings with Friends and Family: Not on file  . Attends Religious Services: Not on file  . Active Member of Clubs or Organizations: Not on file  . Attends Banker Meetings: Not on file  . Marital Status: Not on file  Intimate Partner Violence:   . Fear of Current or Ex-Partner: Not on file  . Emotionally Abused: Not on file  . Physically Abused: Not on file  . Sexually Abused: Not on file    Family History: No family history on file.  Medications:   Current Outpatient Medications on File Prior to Visit  Medication Sig Dispense Refill  . aspirin EC 81 MG tablet Take 81 mg by mouth daily. Swallow whole.    Marland Kitchen atorvastatin (LIPITOR) 40 MG tablet Take 40 mg by mouth daily.     No current facility-administered medications on file prior to visit.    Allergies:  No Known Allergies    OBJECTIVE:  Physical Exam  Vitals:   12/02/19 0736  BP: 128/87  Pulse: 85  Weight: 159 lb (72.1 kg)  Height: 5\' 8"  (1.727 m)   Body mass index is 24.18 kg/m. No exam data present  General: well  developed, well nourished, pleasant middle-age Caucasian male, seated, in no evident distress Head: head normocephalic and atraumatic.   Neck: supple with no carotid or supraclavicular bruits Cardiovascular: regular rate and rhythm, no murmurs Musculoskeletal: no deformity Skin:  no rash/petichiae Vascular:  Normal pulses all extremities   Neurologic Exam Mental Status: Awake and fully alert. Fluent speech and language. Oriented to place and time. Recent and remote memory intact. Attention span, concentration and fund of knowledge appropriate. Mood and affect appropriate.  Cranial Nerves: Pupils equal, briskly reactive to light. Extraocular movements full without nystagmus. Visual fields full to confrontation. Hearing intact. Facial sensation intact. Face, tongue, palate moves normally and symmetrically.  Motor: Normal bulk and tone. Normal strength in all tested extremity muscles. Sensory.: intact  to touch , pinprick , position and vibratory sensation.  Coordination: Rapid alternating movements normal in all extremities. Finger-to-nose and heel-to-shin performed accurately bilaterally. Gait and Station: Arises from chair without difficulty. Stance is normal. Gait demonstrates normal stride length and balance without use of assistive device.  Able to heel, toe and tandem walk without difficulty Reflexes: 1+ and symmetric. Toes downgoing.       ASSESSMENT: Clinton Padilla is a 59 y.o. 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 Vascular risk factors include tobacco use and HLD.      PLAN:  1. R lateral thalamic/PLIC stroke:  a. Residual deficit: Mild left hand and bottom of foot numbness overall greatly improving b. Continue aspirin 81 mg daily  and atorvastatin 40 mg daily for secondary stroke prevention.  c. Discussed secondary stroke  prevention measures and importance of close PCP follow up for aggressive stroke risk factor management  2. HLD:  a. LDL goal <70.  On atorvastatin 40 mg daily per PCP 3. Tobacco use: a. Discussed importance of complete cessation b. Patient willing to quit planning on use of Chantix as he had prior success c. Plans on following up with PCP   Overall stable from stroke standpoint and recommend follow-up on an as-needed basis   I spent 30 minutes of face-to-face and non-face-to-face time with patient. This included previsit chart review, lab review, study review, order entry, electronic health record documentation, patient education regarding prior stroke, residual deficits, secondary stroke prevention measures and importance of managing stroke risk factors and answered all questions to patient satisfaction   Ihor Austin, [facility]  [facility] [address] [place], [place] [address]  Phone [phone] Fax [phone] Note: This document was prepared with digital dictation and possible smart phrase technology. Any transcriptional errors that result from this process are unintentional.

## 2019-12-02 NOTE — Progress Notes (Signed)
I agree with the above plan 

## 2019-12-02 NOTE — Patient Instructions (Signed)
Your Plan:  Continue current treatment plan Continue to follow with PCP for aggressive stroke risk factor management     Thank you for coming to see [place] at [facility]. I hope we have been able to provide you high quality care today.  You may receive a patient satisfaction survey over the next few weeks. We would appreciate your feedback and comments so that we may continue to improve ourselves and the health of our patients.

## 2020-02-28 ENCOUNTER — Other Ambulatory Visit (HOSPITAL_COMMUNITY): Payer: Self-pay | Admitting: Family Medicine

## 2020-02-29 MED FILL — ATORVASTATIN 40 MG TABLET: 40 | 90 days supply | Qty: 90 | Fill #0

## 2020-05-25 ENCOUNTER — Other Ambulatory Visit (HOSPITAL_COMMUNITY): Payer: Self-pay

## 2020-05-25 MED FILL — Atorvastatin Calcium Tab 40 MG (Base Equivalent): ORAL | 90 days supply | Qty: 90 | Fill #0 | Status: AC

## 2020-07-17 ENCOUNTER — Other Ambulatory Visit: Payer: Self-pay

## 2020-08-26 ENCOUNTER — Other Ambulatory Visit (HOSPITAL_COMMUNITY): Payer: Self-pay

## 2020-08-28 ENCOUNTER — Other Ambulatory Visit (HOSPITAL_COMMUNITY): Payer: Self-pay

## 2020-08-29 ENCOUNTER — Other Ambulatory Visit (HOSPITAL_COMMUNITY): Payer: Self-pay

## 2020-08-29 MED ORDER — ATORVASTATIN CALCIUM 40 MG PO TABS
40.0000 mg | ORAL_TABLET | Freq: Every day | ORAL | 0 refills | Status: DC
  Filled 2020-08-29: qty 30, 30d supply, fill #0

## 2020-08-29 MED ORDER — ATORVASTATIN CALCIUM 40 MG PO TABS
40.0000 mg | ORAL_TABLET | Freq: Every day | ORAL | 0 refills | Status: DC
  Filled 2021-08-10: qty 30, 30d supply, fill #0

## 2020-08-31 ENCOUNTER — Other Ambulatory Visit (HOSPITAL_COMMUNITY): Payer: Self-pay

## 2020-09-05 DIAGNOSIS — E7800 Pure hypercholesterolemia, unspecified: Secondary | ICD-10-CM | POA: Diagnosis not present

## 2020-09-05 DIAGNOSIS — R030 Elevated blood-pressure reading, without diagnosis of hypertension: Secondary | ICD-10-CM | POA: Diagnosis not present

## 2020-09-05 DIAGNOSIS — Z8673 Personal history of transient ischemic attack (TIA), and cerebral infarction without residual deficits: Secondary | ICD-10-CM | POA: Diagnosis not present

## 2020-09-05 DIAGNOSIS — I700 Atherosclerosis of aorta: Secondary | ICD-10-CM | POA: Diagnosis not present

## 2020-09-05 DIAGNOSIS — R7303 Prediabetes: Secondary | ICD-10-CM | POA: Diagnosis not present

## 2020-09-26 ENCOUNTER — Other Ambulatory Visit (HOSPITAL_COMMUNITY): Payer: Self-pay

## 2020-09-27 ENCOUNTER — Other Ambulatory Visit (HOSPITAL_COMMUNITY): Payer: Self-pay

## 2020-09-27 MED ORDER — ATORVASTATIN CALCIUM 40 MG PO TABS
40.0000 mg | ORAL_TABLET | Freq: Every day | ORAL | 3 refills | Status: DC
  Filled 2020-09-27: qty 90, 90d supply, fill #0
  Filled 2020-12-31: qty 90, 90d supply, fill #1
  Filled 2021-05-16: qty 90, 90d supply, fill #2
  Filled 2021-09-09: qty 90, 90d supply, fill #3

## 2020-11-16 DIAGNOSIS — S43402A Unspecified sprain of left shoulder joint, initial encounter: Secondary | ICD-10-CM | POA: Diagnosis not present

## 2020-12-08 DIAGNOSIS — M25512 Pain in left shoulder: Secondary | ICD-10-CM | POA: Diagnosis not present

## 2020-12-20 DIAGNOSIS — M67912 Unspecified disorder of synovium and tendon, left shoulder: Secondary | ICD-10-CM | POA: Diagnosis not present

## 2021-01-01 ENCOUNTER — Other Ambulatory Visit (HOSPITAL_COMMUNITY): Payer: Self-pay

## 2021-02-14 IMAGING — MR MR HEAD WO/W CM
12 of 14 series · 40 of 48 positions shown · IV contrast (gadavist)
Comparison: CT head 08/02/2019

CLINICAL DATA: Focal neuro deficit rule out stroke.

EXAM:
MRI HEAD WITHOUT AND WITH CONTRAST
TECHNIQUE: Multiplanar, multiecho pulse sequences of the brain and surrounding
structures were obtained without and with intravenous contrast.
CONTRAST:  6.5mL GADAVIST GADOBUTROL 1 MMOL/ML IV SOLN

[Series 5: DWI · axial · 3.0mm · 0.88mm/px · z∈[-36,+105]mm · 7 of 96 slices shown (1 of 4)]
[im 1/96]
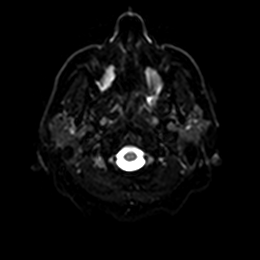
[im 16/96]
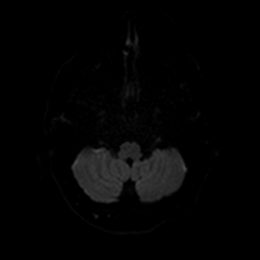
[im 32/96]
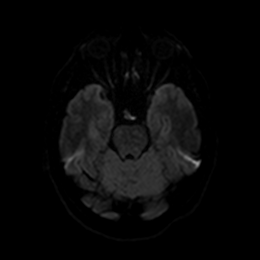
[im 48/96]
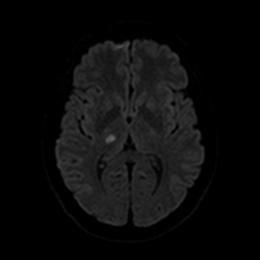
[im 64/96]
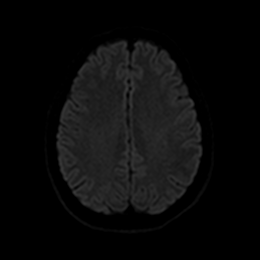
[im 80/96]
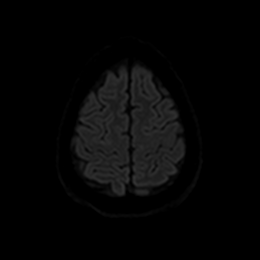
[im 96/96]
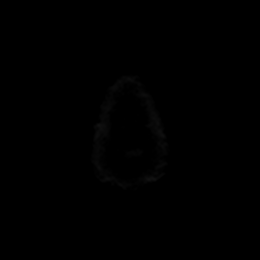

[Series 6: DWI · axial · 3.0mm · 0.88mm/px · z∈[-36,+105]mm · 4 of 48 slices shown (2 of 4)]
[im 1/48]
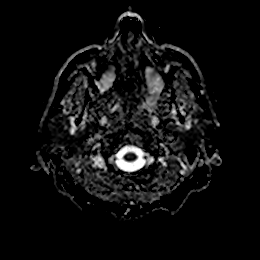
[im 16/48]
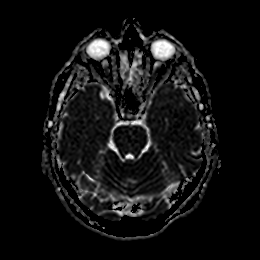
[im 32/48]
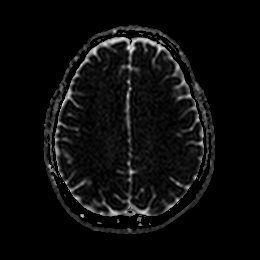
[im 48/48]
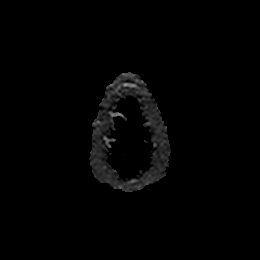

[Series 7: DWI · coronal · 4.0mm · 0.88mm/px · 6 of 72 slices shown (3 of 4)]
[im 1/72]
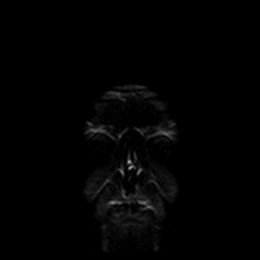
[im 15/72]
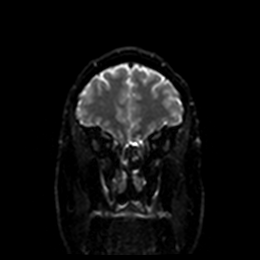
[im 29/72]
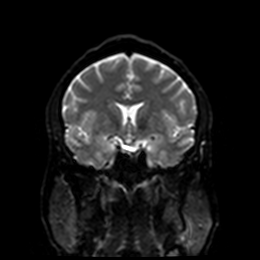
[im 43/72]
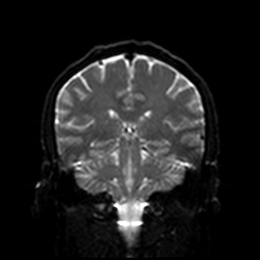
[im 57/72]
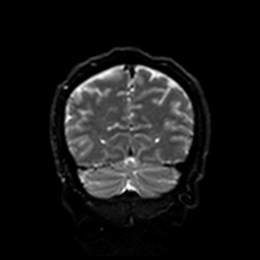
[im 72/72]
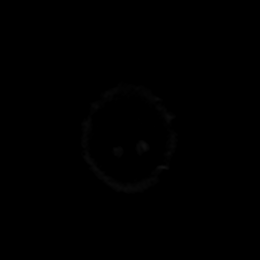

[Series 8: DWI · coronal · 4.0mm · 0.88mm/px · 3 of 36 slices shown (4 of 4)]
[im 1/36]
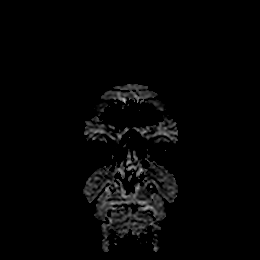
[im 18/36]
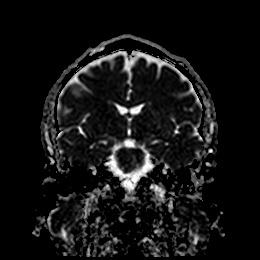
[im 36/36]
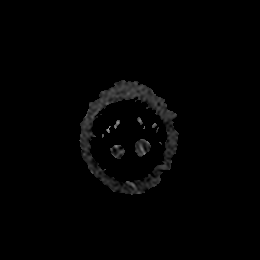

[Series 9: FLAIR · axial · 5.0mm · 0.45mm/px · z∈[-37,+107]mm · 2 of 25 slices shown]
[im 1/25]
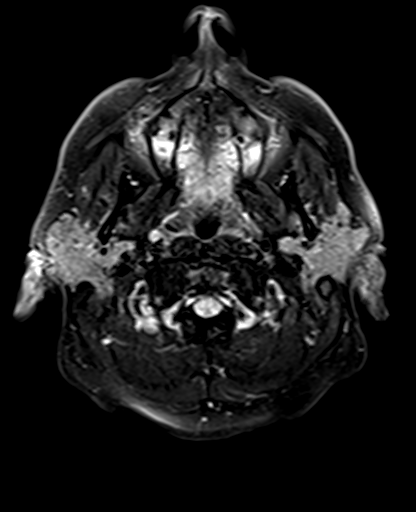
[im 25/25]
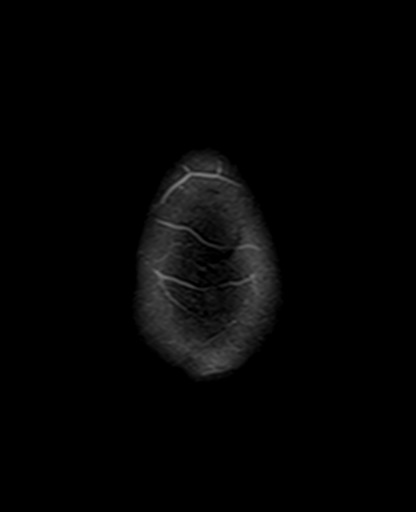

[Series 11: pha_images · axial · 3.0mm · 0.90mm/px · z∈[-41,+111]mm · 4 of 52 slices shown]
[im 1/52]
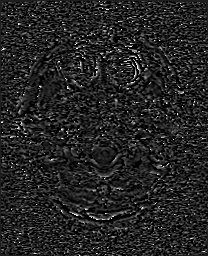
[im 18/52]
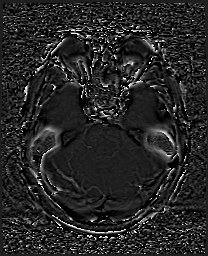
[im 35/52]
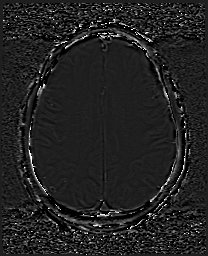
[im 52/52]
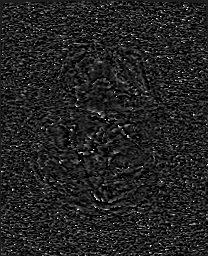

[Series 12: swi_images · axial · 3.0mm · 0.90mm/px · z∈[-41,+111]mm · 4 of 52 slices shown]
[im 1/52]
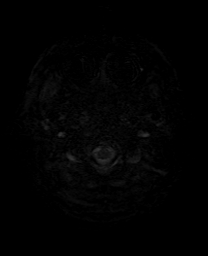
[im 18/52]
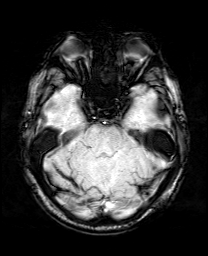
[im 35/52]
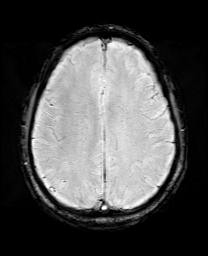
[im 52/52]
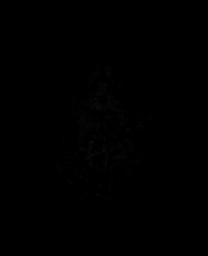

[Series 14: T1 · sagittal · 5.0mm · 0.75mm/px · 2 of 25 slices shown]
[im 1/25]
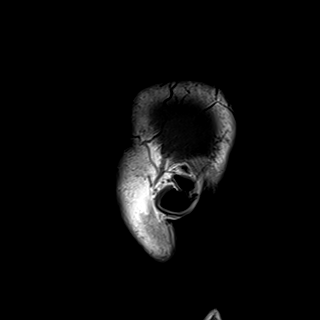
[im 25/25]
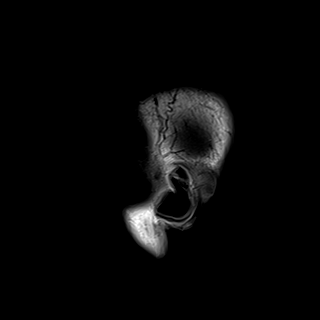

[Series 15: T2 · axial · 5.0mm · 0.72mm/px · z∈[-37,+107]mm · 2 of 25 slices shown]
[im 1/25]
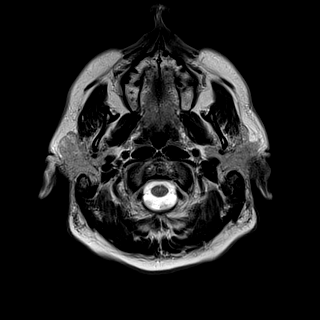
[im 25/25]
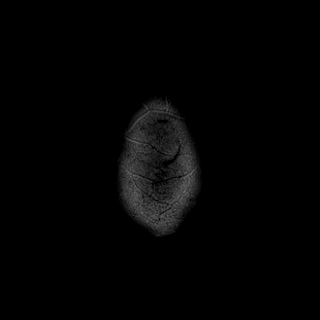

[Series 17: T2 post-contrast · coronal · 5.0mm · 0.72mm/px · 2 of 30 slices shown]
[im 1/30]
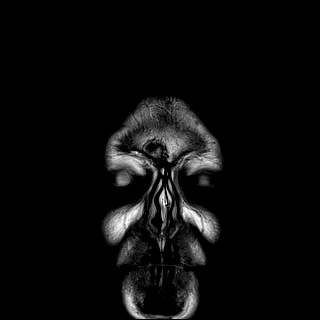
[im 30/30]
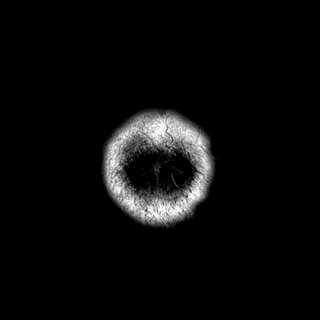

[Series 19: T1 post-contrast · coronal · 5.0mm · 0.34mm/px · 2 of 30 slices shown (1 of 2)]
[im 1/30]
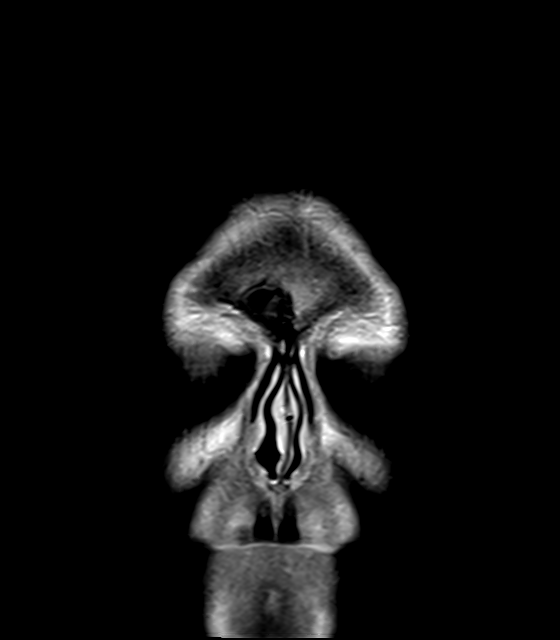
[im 30/30]
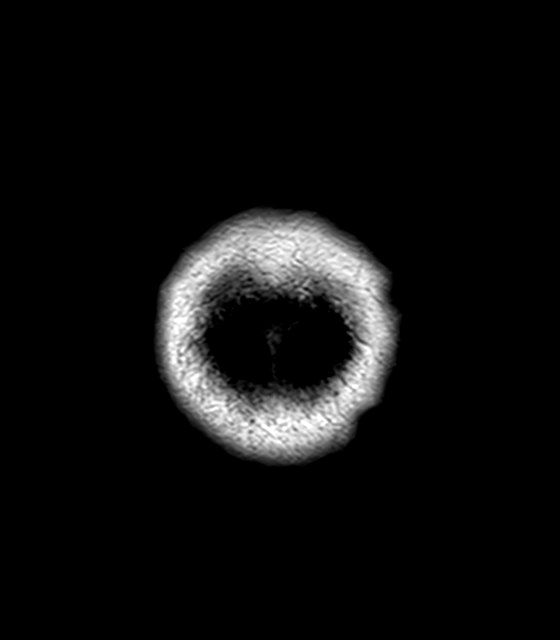

[Series 20: T1 post-contrast · sagittal · 5.0mm · 0.72mm/px · 2 of 25 slices shown (2 of 2)]
[im 1/25]
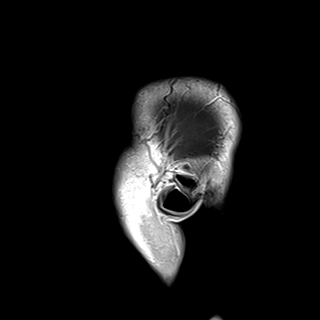
[im 25/25]
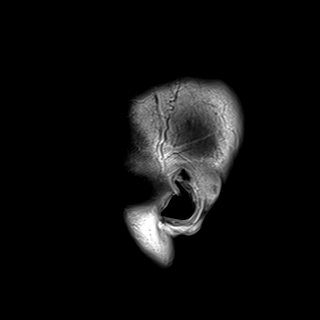

[40 of 48 positions shown; findings below may reference images not displayed]

FINDINGS: Brain: Small acute infarct right lateral thalamus and posterior limb
internal capsule measuring up to 10 mm in diameter. No other acute
infarct. No chronic ischemic change. Normal ventricular size.

Negative for hemorrhage or mass.

Normal enhancement.

Vascular: Normal arterial flow voids.

Skull and upper cervical spine: No focal skeletal lesion.

Sinuses/Orbits: Moderate mucosal edema paranasal sinuses. Negative
orbit

Other: None
IMPRESSION: 1 cm acute infarct right lateral thalamus and posterior limb
internal capsule. No significant chronic ischemic change.

## 2021-03-08 DIAGNOSIS — E7800 Pure hypercholesterolemia, unspecified: Secondary | ICD-10-CM | POA: Diagnosis not present

## 2021-03-08 DIAGNOSIS — R7303 Prediabetes: Secondary | ICD-10-CM | POA: Diagnosis not present

## 2021-03-08 DIAGNOSIS — Z8673 Personal history of transient ischemic attack (TIA), and cerebral infarction without residual deficits: Secondary | ICD-10-CM | POA: Diagnosis not present

## 2021-03-08 DIAGNOSIS — Z720 Tobacco use: Secondary | ICD-10-CM | POA: Diagnosis not present

## 2021-03-08 DIAGNOSIS — I700 Atherosclerosis of aorta: Secondary | ICD-10-CM | POA: Diagnosis not present

## 2021-05-16 ENCOUNTER — Other Ambulatory Visit (HOSPITAL_COMMUNITY): Payer: Self-pay

## 2021-08-10 ENCOUNTER — Other Ambulatory Visit (HOSPITAL_COMMUNITY): Payer: Self-pay

## 2021-08-13 ENCOUNTER — Other Ambulatory Visit (HOSPITAL_COMMUNITY): Payer: Self-pay

## 2021-08-14 ENCOUNTER — Other Ambulatory Visit (HOSPITAL_COMMUNITY): Payer: Self-pay

## 2021-08-14 MED ORDER — ATORVASTATIN CALCIUM 40 MG PO TABS
40.0000 mg | ORAL_TABLET | Freq: Every day | ORAL | 1 refills | Status: DC
  Filled 2021-12-06: qty 90, 90d supply, fill #0
  Filled 2022-03-04: qty 90, 90d supply, fill #1

## 2021-09-10 ENCOUNTER — Other Ambulatory Visit (HOSPITAL_COMMUNITY): Payer: Self-pay

## 2021-09-20 DIAGNOSIS — E7800 Pure hypercholesterolemia, unspecified: Secondary | ICD-10-CM | POA: Diagnosis not present

## 2021-09-20 DIAGNOSIS — Z8673 Personal history of transient ischemic attack (TIA), and cerebral infarction without residual deficits: Secondary | ICD-10-CM | POA: Diagnosis not present

## 2021-09-20 DIAGNOSIS — Z125 Encounter for screening for malignant neoplasm of prostate: Secondary | ICD-10-CM | POA: Diagnosis not present

## 2021-09-20 DIAGNOSIS — Z1211 Encounter for screening for malignant neoplasm of colon: Secondary | ICD-10-CM | POA: Diagnosis not present

## 2021-09-20 DIAGNOSIS — I700 Atherosclerosis of aorta: Secondary | ICD-10-CM | POA: Diagnosis not present

## 2021-09-20 DIAGNOSIS — Z0000 Encounter for general adult medical examination without abnormal findings: Secondary | ICD-10-CM | POA: Diagnosis not present

## 2021-09-20 DIAGNOSIS — Z23 Encounter for immunization: Secondary | ICD-10-CM | POA: Diagnosis not present

## 2021-09-20 DIAGNOSIS — R7303 Prediabetes: Secondary | ICD-10-CM | POA: Diagnosis not present

## 2021-12-06 ENCOUNTER — Other Ambulatory Visit (HOSPITAL_COMMUNITY): Payer: Self-pay

## 2022-03-22 ENCOUNTER — Other Ambulatory Visit (HOSPITAL_COMMUNITY): Payer: Self-pay

## 2022-03-22 DIAGNOSIS — R7303 Prediabetes: Secondary | ICD-10-CM | POA: Diagnosis not present

## 2022-03-22 DIAGNOSIS — R030 Elevated blood-pressure reading, without diagnosis of hypertension: Secondary | ICD-10-CM | POA: Diagnosis not present

## 2022-03-22 DIAGNOSIS — R0781 Pleurodynia: Secondary | ICD-10-CM | POA: Diagnosis not present

## 2022-03-22 DIAGNOSIS — Z8673 Personal history of transient ischemic attack (TIA), and cerebral infarction without residual deficits: Secondary | ICD-10-CM | POA: Diagnosis not present

## 2022-03-22 DIAGNOSIS — I700 Atherosclerosis of aorta: Secondary | ICD-10-CM | POA: Diagnosis not present

## 2022-03-22 DIAGNOSIS — E7800 Pure hypercholesterolemia, unspecified: Secondary | ICD-10-CM | POA: Diagnosis not present

## 2022-03-22 DIAGNOSIS — M25511 Pain in right shoulder: Secondary | ICD-10-CM | POA: Diagnosis not present

## 2022-03-22 MED ORDER — ATORVASTATIN CALCIUM 40 MG PO TABS
40.0000 mg | ORAL_TABLET | Freq: Every day | ORAL | 3 refills | Status: AC
  Filled 2022-03-22 – 2022-06-02 (×2): qty 90, 90d supply, fill #0
  Filled 2022-08-29: qty 90, 90d supply, fill #1
  Filled 2022-11-28 – 2023-01-30 (×3): qty 90, 90d supply, fill #2

## 2022-05-23 DIAGNOSIS — D123 Benign neoplasm of transverse colon: Secondary | ICD-10-CM | POA: Diagnosis not present

## 2022-05-23 DIAGNOSIS — Z1211 Encounter for screening for malignant neoplasm of colon: Secondary | ICD-10-CM | POA: Diagnosis not present

## 2022-06-03 ENCOUNTER — Other Ambulatory Visit (HOSPITAL_COMMUNITY): Payer: Self-pay

## 2022-06-03 ENCOUNTER — Other Ambulatory Visit: Payer: Self-pay

## 2022-06-28 ENCOUNTER — Other Ambulatory Visit: Payer: Self-pay

## 2022-09-04 ENCOUNTER — Other Ambulatory Visit: Payer: Self-pay

## 2022-09-18 ENCOUNTER — Other Ambulatory Visit: Payer: Self-pay | Admitting: Family Medicine

## 2022-09-18 DIAGNOSIS — Z122 Encounter for screening for malignant neoplasm of respiratory organs: Secondary | ICD-10-CM

## 2022-09-20 ENCOUNTER — Ambulatory Visit
Admission: RE | Admit: 2022-09-20 | Discharge: 2022-09-20 | Disposition: A | Discharge status: Home / Self Care | Payer: Managed Care, Other (non HMO) | Source: Ambulatory Visit | Attending: Family Medicine | Admitting: Family Medicine

## 2022-09-20 DIAGNOSIS — Z122 Encounter for screening for malignant neoplasm of respiratory organs: Secondary | ICD-10-CM

## 2022-11-05 ENCOUNTER — Ambulatory Visit (INDEPENDENT_AMBULATORY_CARE_PROVIDER_SITE_OTHER): Payer: Managed Care, Other (non HMO) | Admitting: Otolaryngology

## 2022-11-05 ENCOUNTER — Encounter (INDEPENDENT_AMBULATORY_CARE_PROVIDER_SITE_OTHER): Payer: Self-pay

## 2022-11-05 VITALS — Ht 68.0 in | Wt 162.0 lb

## 2022-11-05 DIAGNOSIS — H6521 Chronic serous otitis media, right ear: Secondary | ICD-10-CM

## 2022-11-05 DIAGNOSIS — Z8669 Personal history of other diseases of the nervous system and sense organs: Secondary | ICD-10-CM | POA: Diagnosis not present

## 2022-11-05 DIAGNOSIS — Z8616 Personal history of COVID-19: Secondary | ICD-10-CM

## 2022-11-05 DIAGNOSIS — H6981 Other specified disorders of Eustachian tube, right ear: Secondary | ICD-10-CM

## 2022-11-05 DIAGNOSIS — H9011 Conductive hearing loss, unilateral, right ear, with unrestricted hearing on the contralateral side: Secondary | ICD-10-CM

## 2022-11-05 DIAGNOSIS — Z09 Encounter for follow-up examination after completed treatment for conditions other than malignant neoplasm: Secondary | ICD-10-CM | POA: Diagnosis not present

## 2022-11-06 DIAGNOSIS — H6521 Chronic serous otitis media, right ear: Secondary | ICD-10-CM | POA: Insufficient documentation

## 2022-11-06 DIAGNOSIS — H6981 Other specified disorders of Eustachian tube, right ear: Secondary | ICD-10-CM | POA: Insufficient documentation

## 2022-11-06 DIAGNOSIS — H9011 Conductive hearing loss, unilateral, right ear, with unrestricted hearing on the contralateral side: Secondary | ICD-10-CM | POA: Insufficient documentation

## 2022-11-06 NOTE — Progress Notes (Signed)
Patient ID: Clinton Padilla, male   DOB: [birth year], 61 y.o.   MRN: [id number]  Follow-up: Right ear hearing loss, right middle ear effusion  HPI: The patient is a 62 year old male who returns today for his follow-up evaluation.  He was last seen 1 month ago.  At that time, he was complaining of right ear hearing loss and clogging sensation in the right ear.  His symptoms started after his COVID infection.  He was noted to have right middle ear effusion and right ear conductive hearing loss.  He was treated with Flonase nasal spray and Valsalva exercise.  The patient returns today reporting resolution of his right ear hearing loss.  His hearing has significantly improved.  Currently he denies any otalgia or otorrhea.  Exam: General: Communicates without difficulty, well nourished, no acute distress. Head: Normocephalic, no evidence injury, no tenderness, facial buttresses intact without stepoff. Face/sinus: No tenderness to palpation and percussion. Facial movement is normal and symmetric. Eyes: PERRL, EOMI. No scleral icterus, conjunctivae clear. Neuro: CN II exam reveals vision grossly intact.  No nystagmus at any point of gaze. Ears: Auricles well formed without lesions.  Ear canals are intact without mass or lesion.  No erythema or edema is appreciated.  The TMs are intact without fluid. Nose: External evaluation reveals normal support and skin without lesions.  Dorsum is intact.  Anterior rhinoscopy reveals congested mucosa over anterior aspect of inferior turbinates and intact septum.  No purulence noted. Oral:  Oral cavity and oropharynx are intact, symmetric, without erythema or edema.  Mucosa is moist without lesions. Neck: Full range of motion without pain.  There is no significant lymphadenopathy.  No masses palpable.  Thyroid bed within normal limits to palpation.  Parotid glands and submandibular glands equal bilaterally without mass.  Trachea is midline. Neuro:  CN 2-12 grossly intact.     Assessment: 1.  The patient's right middle ear effusion and right eustachian tube dysfunction have resolved. 2.  The patient reports normal hearing bilaterally.  Plan: 1.  The physical exam findings are reviewed with the patient. 2.  Flonase nasal spray and Valsalva exercise as needed. 3.  The patient is encouraged to call with any questions or concerns.

## 2022-11-28 ENCOUNTER — Other Ambulatory Visit (HOSPITAL_COMMUNITY): Payer: Self-pay

## 2022-12-10 ENCOUNTER — Other Ambulatory Visit (HOSPITAL_COMMUNITY): Payer: Self-pay

## 2023-01-06 ENCOUNTER — Other Ambulatory Visit (HOSPITAL_COMMUNITY): Payer: Self-pay

## 2023-01-06 MED ORDER — GUAIFENESIN ER 600 MG PO TB12
600.0000 mg | ORAL_TABLET | Freq: Two times a day (BID) | ORAL | 1 refills | Status: AC | PRN
  Filled 2023-01-30: qty 60, 30d supply, fill #0

## 2023-01-06 MED ORDER — PREDNISONE 20 MG PO TABS
ORAL_TABLET | ORAL | 0 refills | Status: AC
  Filled 2023-01-06: qty 12, 6d supply, fill #0

## 2023-01-06 MED ORDER — ALBUTEROL SULFATE HFA 108 (90 BASE) MCG/ACT IN AERS
1.0000 | INHALATION_SPRAY | RESPIRATORY_TRACT | 0 refills | Status: DC
  Filled 2023-01-06: qty 1, 34d supply, fill #0
  Filled 2023-01-06: qty 6.7, 30d supply, fill #0

## 2023-01-06 MED ORDER — NICOTINE 7 MG/24HR TD PT24
7.0000 mg | MEDICATED_PATCH | Freq: Every day | TRANSDERMAL | 0 refills | Status: AC
  Filled 2023-01-30: qty 14, 14d supply, fill #0

## 2023-01-06 MED ORDER — NICOTINE 21 MG/24HR TD PT24
21.0000 mg | MEDICATED_PATCH | Freq: Every day | TRANSDERMAL | 0 refills | Status: AC
  Filled 2023-01-06: qty 28, 28d supply, fill #0

## 2023-01-06 MED ORDER — NICOTINE 14 MG/24HR TD PT24
14.0000 mg | MEDICATED_PATCH | Freq: Every day | TRANSDERMAL | 0 refills | Status: AC
  Filled 2023-01-30: qty 14, 14d supply, fill #0

## 2023-01-30 ENCOUNTER — Other Ambulatory Visit (HOSPITAL_COMMUNITY): Payer: Self-pay

## 2023-01-30 ENCOUNTER — Other Ambulatory Visit: Payer: Self-pay

## 2023-01-30 MED ORDER — ALBUTEROL SULFATE HFA 108 (90 BASE) MCG/ACT IN AERS
1.0000 | INHALATION_SPRAY | RESPIRATORY_TRACT | 1 refills | Status: AC | PRN
  Filled 2023-01-30: qty 6.7, 33d supply, fill #0

## 2023-01-31 ENCOUNTER — Other Ambulatory Visit: Payer: Self-pay

## 2023-01-31 ENCOUNTER — Other Ambulatory Visit (HOSPITAL_COMMUNITY): Payer: Self-pay

## 2023-01-31 ENCOUNTER — Encounter (HOSPITAL_COMMUNITY): Payer: Self-pay

## 2023-03-06 ENCOUNTER — Ambulatory Visit (INDEPENDENT_AMBULATORY_CARE_PROVIDER_SITE_OTHER): Payer: Managed Care, Other (non HMO) | Admitting: Pulmonary Disease

## 2023-03-06 ENCOUNTER — Encounter (HOSPITAL_BASED_OUTPATIENT_CLINIC_OR_DEPARTMENT_OTHER): Payer: Self-pay | Admitting: Pulmonary Disease

## 2023-03-06 VITALS — BP 126/82 | HR 68 | Ht 68.0 in | Wt 165.4 lb

## 2023-03-06 DIAGNOSIS — J449 Chronic obstructive pulmonary disease, unspecified: Secondary | ICD-10-CM

## 2023-03-06 DIAGNOSIS — Z720 Tobacco use: Secondary | ICD-10-CM

## 2023-03-06 DIAGNOSIS — J432 Centrilobular emphysema: Secondary | ICD-10-CM

## 2023-03-06 MED ORDER — VARENICLINE TARTRATE 1 MG PO TABS: 1.0000 mg | ORAL_TABLET | Freq: Two times a day (BID) | ORAL | 2 refills | Status: AC

## 2023-03-06 NOTE — Progress Notes (Signed)
Subjective:    Patient ID: Clinton Padilla, male    DOB: Sep 03, 1960, 63 y.o.   MRN: [id number]  HPI  The patient, a current smoker with a history of emphysema, presents with increased mucus production, sputum, wheezing, and shortness of breath. These symptoms have been ongoing for approximately five months, following a cold or similar illness. The patient reports that the symptoms are particularly noticeable after exertion, such as taking out the trash or mowing the lawn, and during the night. The patient has been using an albuterol inhaler and nicotine patches in an attempt to quit smoking. The patient is interested in getting tests done to establish a baseline for his lung health.  He smoked about a pack per day, more than 40 pack years  Significant tests/ events reviewed  LDCT chest 08/2022 Centrilobular and paraseptal emphysema  Bilateral pulmonary nodules  - 3.5 mm  History reviewed. No pertinent past medical history.   Past Surgical History:  Procedure Laterality Date   ELBOW SURGERY     LAPAROSCOPIC APPENDECTOMY N/A 12/05/2014   Procedure: APPENDECTOMY LAPAROSCOPIC;  Surgeon: Luretha Murphy, MD;  Location: [facility];  Service: General;  Laterality: N/A;    No Known Allergies  Social History   Socioeconomic History   Marital status: Married    Spouse name: Not on file   Number of children: Not on file   Years of education: Not on file   Highest education level: Not on file  Occupational History   Not on file  Tobacco Use   Smoking status: Every Day    Types: Cigarettes   Smokeless tobacco: Never  Substance and Sexual Activity   Alcohol use: No    Comment: social    Drug use: Never   Sexual activity: Not on file  Other Topics Concern   Not on file  Social History Narrative   Not on file   Social Drivers of Health   Financial Resource Strain: Not on file  Food Insecurity: Not on file  Transportation Needs: Not on file  Physical Activity: Not on file   Stress: Not on file  Social Connections: Not on file  Intimate Partner Violence: Not on file    History reviewed. No pertinent family history.   Review of Systems Constitutional: negative for anorexia, fevers and sweats  Eyes: negative for irritation, redness and visual disturbance  Ears, nose, mouth, throat, and face: negative for earaches, epistaxis, nasal congestion and sore throat   Cardiovascular: negative for chest pain,  lower extremity edema, orthopnea, palpitations and syncope  Gastrointestinal: negative for abdominal pain, constipation, diarrhea, melena, nausea and vomiting  Genitourinary:negative for dysuria, frequency and hematuria  Hematologic/lymphatic: negative for bleeding, easy bruising and lymphadenopathy  Musculoskeletal:negative for arthralgias, muscle weakness and stiff joints  Neurological: negative for coordination problems, gait problems, headaches and weakness  Endocrine: negative for diabetic symptoms including polydipsia, polyuria and weight loss     Objective:   Physical Exam  Gen. Pleasant, well-nourished, in no distress, normal affect ENT - no pallor,icterus, no post nasal drip Neck: No JVD, no thyromegaly, no carotid bruits Lungs: no use of accessory muscles, no dullness to percussion, clear without rales or rhonchi  Cardiovascular: Rhythm regular, heart sounds  normal, no murmurs or gallops, no peripheral edema Abdomen: soft and non-tender, no hepatosplenomegaly, BS normal. 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 Symptoms began five months ago post-respiratory illness. CT shows significant emphysema with nodules and scarring. Continues to smoke one pack/day, exacerbating the condition. Discussed irreversible lung damage but emphasized smoking cessation  to slow disease progression. Lung function declines faster in smokers but slows significantly upon cessation. Current symptoms suggest significant emphysema; PFT will quantify lung impairment. Component of COPD with persistent cough and sputum production. Symptoms include increased mucus and wheezing, especially when supine. Chronic bronchitis results from smoking-induced lung lining damage, leading to constant cough and phlegm. - Order PFT to assess lung capacity - Continue albuterol PRN - Prescribe generic Chantix for smoking cessation - Schedule follow-up CT in one year - Follow-up in three months to review PFT results and reassess treatment plan    Tobacco abuse Long-term smoker (since age 33) with multiple quit attempts using Chantix, patches, and Nicotrol inhaler. Emphasized quitting smoking to slow emphysema progression and improve lung function. Reviewed Chantix side effects and benefits of combining with nicotine replacement therapy. Discussed behavioral strategies like smoke-free areas and specific times to avoid smoking. Chantix increases quit rates but may cause mood changes. - Prescribe generic Chantix - Advise nicotine patches and Nicotrol inhaler PRN - Discussed behavioral strategies to reduce smoking  Follow-up - Follow-up in three months to review PFT results and reassess treatment plan - Schedule follow-up CT in one year.

## 2023-03-06 NOTE — Patient Instructions (Signed)
X schedule pFT  X Rx for chantix

## 2023-04-02 ENCOUNTER — Ambulatory Visit (INDEPENDENT_AMBULATORY_CARE_PROVIDER_SITE_OTHER): Payer: Managed Care, Other (non HMO) | Admitting: Pulmonary Disease

## 2023-04-02 DIAGNOSIS — J432 Centrilobular emphysema: Secondary | ICD-10-CM | POA: Diagnosis not present

## 2023-04-02 LAB — PULMONARY FUNCTION TEST
DL/VA % pred: 72 %
DL/VA: 3.09 ml/min/mmHg/L
DLCO cor % pred: 73 %
DLCO cor: 18.54 ml/min/mmHg
DLCO unc % pred: 76 %
DLCO unc: 19.55 ml/min/mmHg
FEF 25-75 Post: 1.16 L/s
FEF 25-75 Pre: 1.23 L/s
FEF2575-%Change-Post: -5 %
FEF2575-%Pred-Post: 43 %
FEF2575-%Pred-Pre: 45 %
FEV1-%Change-Post: -3 %
FEV1-%Pred-Post: 65 %
FEV1-%Pred-Pre: 68 %
FEV1-Post: 2.14 L
FEV1-Pre: 2.22 L
FEV1FVC-%Change-Post: -6 %
FEV1FVC-%Pred-Pre: 87 %
FEV6-%Change-Post: 1 %
FEV6-%Pred-Post: 82 %
FEV6-%Pred-Pre: 80 %
FEV6-Post: 3.38 L
FEV6-Pre: 3.32 L
FEV6FVC-%Change-Post: 0 %
FEV6FVC-%Pred-Post: 103 %
FEV6FVC-%Pred-Pre: 103 %
FVC-%Change-Post: 2 %
FVC-%Pred-Post: 79 %
FVC-%Pred-Pre: 77 %
FVC-Post: 3.44 L
FVC-Pre: 3.36 L
Post FEV1/FVC ratio: 62 %
Post FEV6/FVC ratio: 98 %
Pre FEV1/FVC ratio: 66 %
Pre FEV6/FVC Ratio: 99 %
RV % pred: 154 %
RV: 3.29 L
TLC % pred: 108 %
TLC: 7.02 L

## 2023-04-02 NOTE — Progress Notes (Signed)
 Full PFT Performed Today

## 2023-04-02 NOTE — Patient Instructions (Signed)
 Full PFT Performed Today

## 2023-04-08 ENCOUNTER — Other Ambulatory Visit (HOSPITAL_COMMUNITY): Payer: Self-pay

## 2023-04-08 MED ORDER — TELMISARTAN 20 MG PO TABS
20.0000 mg | ORAL_TABLET | Freq: Every day | ORAL | 1 refills | Status: DC
  Filled 2023-04-08: qty 30, 30d supply, fill #0
  Filled 2023-05-04: qty 30, 30d supply, fill #1

## 2023-05-09 ENCOUNTER — Other Ambulatory Visit: Payer: Self-pay

## 2023-05-13 ENCOUNTER — Other Ambulatory Visit (HOSPITAL_COMMUNITY): Payer: Self-pay

## 2023-05-13 MED ORDER — TELMISARTAN 40 MG PO TABS
40.0000 mg | ORAL_TABLET | Freq: Every day | ORAL | 1 refills | Status: DC
  Filled 2023-05-28: qty 90, 90d supply, fill #0
  Filled 2023-08-22: qty 90, 90d supply, fill #1

## 2023-05-28 ENCOUNTER — Other Ambulatory Visit (HOSPITAL_COMMUNITY): Payer: Self-pay

## 2023-06-09 ENCOUNTER — Ambulatory Visit (INDEPENDENT_AMBULATORY_CARE_PROVIDER_SITE_OTHER): Admitting: Pulmonary Disease

## 2023-06-09 ENCOUNTER — Encounter (HOSPITAL_BASED_OUTPATIENT_CLINIC_OR_DEPARTMENT_OTHER): Payer: Self-pay | Admitting: Pulmonary Disease

## 2023-06-09 VITALS — BP 128/86 | HR 86 | Ht 68.0 in | Wt 167.5 lb

## 2023-06-09 DIAGNOSIS — F17210 Nicotine dependence, cigarettes, uncomplicated: Secondary | ICD-10-CM | POA: Diagnosis not present

## 2023-06-09 DIAGNOSIS — J449 Chronic obstructive pulmonary disease, unspecified: Secondary | ICD-10-CM

## 2023-06-09 DIAGNOSIS — Z720 Tobacco use: Secondary | ICD-10-CM

## 2023-06-09 DIAGNOSIS — J4489 Other specified chronic obstructive pulmonary disease: Secondary | ICD-10-CM

## 2023-06-09 NOTE — Progress Notes (Signed)
   Subjective:    Patient ID: Clinton Padilla, male    DOB: July 18, 1960, 63 y.o.   MRN: [id number]  HPI 63 year old smoker for follow-up of COPD  He smoked about a pack per day, more than 40 pack years 4 months visit. He is accompanied by his wife  He continues to smoke up to a pack per day.  We reviewed PFTs that show moderate administration.     Significant tests/ events reviewed   PFTs 03/2023 show ratio 66, FEV1 68%, no bronchodilator response, DLCO 76%  LDCT chest 08/2022 Centrilobular and paraseptal emphysema  Bilateral pulmonary nodules  - 3.5 mm  Review of Systems neg for any significant sore throat, dysphagia, itching, sneezing, nasal congestion or excess/ purulent secretions, fever, chills, sweats, unintended wt loss, pleuritic or exertional cp, hempoptysis, orthopnea pnd or change in chronic leg swelling. Also denies presyncope, palpitations, heartburn, abdominal pain, nausea, vomiting, diarrhea or change in bowel or urinary habits, dysuria,hematuria, rash, arthralgias, visual complaints, headache, numbness weakness or ataxia.     Objective:   Physical Exam  Gen. Pleasant, well-nourished, in no distress ENT - no thrush, no pallor/icterus,no post nasal drip Neck: No JVD, no thyromegaly, no carotid bruits Lungs: no use of accessory muscles, no dullness to percussion, clear without rales or rhonchi  Cardiovascular: Rhythm regular, heart sounds  normal, no murmurs or gallops, no peripheral edema Musculoskeletal: No deformities, no cyanosis or clubbing        Assessment & Plan:   Gold stage II COPD -we discussed the use of maintenance medications, he prefers to wait.  Will continue to use albuterol  on an as-needed basis.  He will call if symptoms are worsening.   Tobacco abuse -we really focused on smoking cessation counseling for this visit.  He has nicotine  patches and I encouraged him to start using 20 mg/day since he is smoking 2 times daily.  He also has Nicotrol   inhalers which may have expired.  He previously had success with Chantix  and is open to using significant Cherina. We also discussed several behavioral techniques that would help with smoking cessation He will be due for annual lung cancer screening CT scan in August

## 2023-06-09 NOTE — Patient Instructions (Signed)
 X Lung cancer screening CT in Aug/sep  Trial of nicotine  patch 33m g/day  OK to trial chantix  again when you feel ready

## 2023-08-23 ENCOUNTER — Other Ambulatory Visit: Payer: Self-pay

## 2023-08-25 ENCOUNTER — Other Ambulatory Visit: Payer: Self-pay

## 2023-10-30 ENCOUNTER — Other Ambulatory Visit (HOSPITAL_COMMUNITY): Payer: Self-pay

## 2023-10-30 ENCOUNTER — Other Ambulatory Visit: Payer: Self-pay | Admitting: Family Medicine

## 2023-10-30 DIAGNOSIS — J439 Emphysema, unspecified: Secondary | ICD-10-CM

## 2023-10-30 DIAGNOSIS — Z122 Encounter for screening for malignant neoplasm of respiratory organs: Secondary | ICD-10-CM

## 2023-10-30 MED ORDER — ATORVASTATIN CALCIUM 40 MG PO TABS
40.0000 mg | ORAL_TABLET | Freq: Every day | ORAL | 1 refills | Status: AC
  Filled 2023-10-30 – 2023-11-19 (×3): qty 90, 90d supply, fill #0
  Filled 2024-02-12: qty 90, 90d supply, fill #1

## 2023-11-05 ENCOUNTER — Inpatient Hospital Stay: Admission: RE | Admit: 2023-11-05 | Source: Ambulatory Visit

## 2023-11-11 ENCOUNTER — Ambulatory Visit
Admission: RE | Admit: 2023-11-11 | Discharge: 2023-11-11 | Disposition: A | Source: Ambulatory Visit | Attending: Family Medicine | Admitting: Family Medicine

## 2023-11-11 DIAGNOSIS — J439 Emphysema, unspecified: Secondary | ICD-10-CM

## 2023-11-11 DIAGNOSIS — Z122 Encounter for screening for malignant neoplasm of respiratory organs: Secondary | ICD-10-CM

## 2023-11-18 ENCOUNTER — Other Ambulatory Visit (HOSPITAL_COMMUNITY): Payer: Self-pay

## 2023-11-18 ENCOUNTER — Other Ambulatory Visit: Payer: Self-pay

## 2023-11-19 ENCOUNTER — Other Ambulatory Visit: Payer: Self-pay

## 2023-11-20 ENCOUNTER — Other Ambulatory Visit (HOSPITAL_COMMUNITY): Payer: Self-pay

## 2023-11-20 ENCOUNTER — Other Ambulatory Visit: Payer: Self-pay

## 2023-11-20 MED ORDER — TELMISARTAN 40 MG PO TABS
40.0000 mg | ORAL_TABLET | Freq: Every day | ORAL | 1 refills | Status: AC
  Filled 2023-11-20 – 2023-11-25 (×2): qty 90, 90d supply, fill #0
  Filled 2024-02-12: qty 90, 90d supply, fill #1

## 2023-11-21 ENCOUNTER — Other Ambulatory Visit (HOSPITAL_COMMUNITY): Payer: Self-pay

## 2023-11-25 ENCOUNTER — Other Ambulatory Visit (HOSPITAL_COMMUNITY): Payer: Self-pay

## 2024-02-12 ENCOUNTER — Other Ambulatory Visit (HOSPITAL_COMMUNITY): Payer: Self-pay

## 2024-02-18 ENCOUNTER — Other Ambulatory Visit (HOSPITAL_COMMUNITY): Payer: Self-pay

## 2024-02-19 ENCOUNTER — Other Ambulatory Visit (HOSPITAL_COMMUNITY): Payer: Self-pay

## 2024-02-20 ENCOUNTER — Other Ambulatory Visit (HOSPITAL_COMMUNITY): Payer: Self-pay

## 2024-02-26 ENCOUNTER — Other Ambulatory Visit (HOSPITAL_COMMUNITY): Payer: Self-pay

## 2024-02-27 ENCOUNTER — Other Ambulatory Visit (HOSPITAL_COMMUNITY): Payer: Self-pay
